# Patient Record
Sex: Male | Born: 1955
Health system: Southern US, Community
[De-identification: ages and names within clinical notes are randomized; demographics above are authoritative.]

## PROBLEM LIST (undated history)

## (undated) DIAGNOSIS — F32A Depression, unspecified: Secondary | ICD-10-CM

## (undated) DIAGNOSIS — H9190 Unspecified hearing loss, unspecified ear: Secondary | ICD-10-CM

## (undated) DIAGNOSIS — G4700 Insomnia, unspecified: Secondary | ICD-10-CM

## (undated) DIAGNOSIS — K219 Gastro-esophageal reflux disease without esophagitis: Secondary | ICD-10-CM

## (undated) DIAGNOSIS — M1990 Unspecified osteoarthritis, unspecified site: Secondary | ICD-10-CM

## (undated) DIAGNOSIS — I10 Essential (primary) hypertension: Secondary | ICD-10-CM

## (undated) DIAGNOSIS — E785 Hyperlipidemia, unspecified: Secondary | ICD-10-CM

## (undated) DIAGNOSIS — T148XXA Other injury of unspecified body region, initial encounter: Secondary | ICD-10-CM

## (undated) DIAGNOSIS — J45909 Unspecified asthma, uncomplicated: Secondary | ICD-10-CM

## (undated) DIAGNOSIS — T7840XA Allergy, unspecified, initial encounter: Secondary | ICD-10-CM

## (undated) DIAGNOSIS — F329 Major depressive disorder, single episode, unspecified: Secondary | ICD-10-CM

## (undated) HISTORY — DX: Depression, unspecified: F32.A

## (undated) HISTORY — PX: SPINE SURGERY: SHX786

## (undated) HISTORY — DX: Allergy, unspecified, initial encounter: T78.40XA

## (undated) HISTORY — DX: Gastro-esophageal reflux disease without esophagitis: K21.9

## (undated) HISTORY — DX: Unspecified osteoarthritis, unspecified site: M19.90

## (undated) HISTORY — DX: Unspecified asthma, uncomplicated: J45.909

## (undated) HISTORY — DX: Hyperlipidemia, unspecified: E78.5

## (undated) HISTORY — DX: Major depressive disorder, single episode, unspecified: F32.9

## (undated) HISTORY — DX: Essential (primary) hypertension: I10

---

## 1998-11-19 ENCOUNTER — Ambulatory Visit (HOSPITAL_COMMUNITY): Admission: RE | Admit: 1998-11-19 | Discharge: 1998-11-19 | Payer: Self-pay | Admitting: Orthopedic Surgery

## 1998-11-19 ENCOUNTER — Encounter: Payer: Self-pay | Admitting: Orthopedic Surgery

## 1998-12-06 ENCOUNTER — Encounter: Admission: RE | Admit: 1998-12-06 | Discharge: 1999-03-06 | Payer: Self-pay | Admitting: Anesthesiology

## 2004-07-18 ENCOUNTER — Emergency Department (HOSPITAL_COMMUNITY): Admission: EM | Admit: 2004-07-18 | Discharge: 2004-07-18 | Payer: Self-pay | Admitting: Emergency Medicine

## 2004-07-22 ENCOUNTER — Emergency Department (HOSPITAL_COMMUNITY): Admission: EM | Admit: 2004-07-22 | Discharge: 2004-07-22 | Payer: Self-pay | Admitting: Family Medicine

## 2004-07-27 ENCOUNTER — Emergency Department (HOSPITAL_COMMUNITY): Admission: EM | Admit: 2004-07-27 | Discharge: 2004-07-27 | Payer: Self-pay | Admitting: Emergency Medicine

## 2006-04-17 ENCOUNTER — Ambulatory Visit (HOSPITAL_COMMUNITY): Admission: RE | Admit: 2006-04-17 | Discharge: 2006-04-17 | Payer: Self-pay | Admitting: Specialist

## 2006-12-18 ENCOUNTER — Encounter: Admission: RE | Admit: 2006-12-18 | Discharge: 2006-12-18 | Payer: Self-pay | Admitting: Specialist

## 2007-01-27 ENCOUNTER — Ambulatory Visit (HOSPITAL_COMMUNITY): Admission: RE | Admit: 2007-01-27 | Discharge: 2007-01-29 | Payer: Self-pay | Admitting: Specialist

## 2009-12-07 ENCOUNTER — Emergency Department (HOSPITAL_COMMUNITY): Admission: EM | Admit: 2009-12-07 | Discharge: 2009-12-07 | Payer: Self-pay | Admitting: Emergency Medicine

## 2011-03-07 NOTE — Op Note (Signed)
NAMEWHALEN, TROMPETER              ACCOUNT NO.:  [id number]   MEDICAL RECORD NO.:  [id number]          PATIENT TYPE:   LOCATION:                                 FACILITY:   PHYSICIAN:  Jene Every, M.D.    DATE OF BIRTH:  09-12-1956   DATE OF PROCEDURE:  01/27/2007  DATE OF DISCHARGE:                               OPERATIVE REPORT   REDICTATION   PREOP DIAGNOSIS:  Spinal stenosis L4-5 and L3-4 left.   POSTOPERATIVE DIAGNOSIS:  Spinal stenosis L4-5 and L3-4 left.   PROCEDURES:  1. Redo decompression L4-5 left.  2. Hemilaminectomy of L4 left.  3. Decompression of the L3-4 left.   BRIEF HISTORY AND INDICATIONS:  This is a 55 year old with recurrent  left lower extremity radicular pain L5 nerve root distribution.  He had  an MRI indicating stenosis at L3-4 and at L4-5.  He had refractory and  conservative treatment mainly in L5 nerve root distribution, indicative  for a L4-5 decompression.  In the holding room it indicated an  alteration in nerve root distribution; and he reported some radicular  symptoms down into the L4 nerve root distribution.  I, therefore,  reexamined and reevaluated his MRI which does show significant stenosis  at  L3-4 as well as at the L4-5.  We discussed in detail, therefore,  decompressing The L4 root which will require extension to L3-4.  He had  essentially minimal-to-no right-sided symptoms so we felt containing  that to the left side would be most appropriate, although indicating  that he may require decompression on the right or centrally in the  future.  We discussed the risks and including bleeding, infection,  __________  surgery, CSF leakage, epidural fibrosis, __________  segment, and the need for fusion in the future,  anesthetic  complications etcetera.  We, therefore, amended the surgical consent  form and then proceeded, accordingly.   TECHNIQUE:  With the patient in the supine position after the induction  of adequate general  anesthesia and 2 grams of Kefzol; he was placed  prone on the __________ frame and all bony processes were well padded;  and the lumbar region prepped and draped in the usual sterile fashion.  We made an incision thorough and above the previous surgical incision.  The subcutaneous tissue was __________  utilized to achieve hemostasis.  Dorsolumbar fascia identified, divided by skin incision.  Paraspinous  muscle elevated from the lamina of L3-4 and L5.  This was confirmed then  by x-ray, in the intraoperative space after a McCullough retractor was  placed.  He had exuberant fibrosis in the paraspinous musculature as  well as the epidural region at L4-5.  The operating microscope was  draped and brought into the surgical field.  I skeletonized the lamina  of L4.  The medial aspect of the posterior portion of the facette at L4-  5; and I identified the interlaminar space at L3-4 which was fairly  narrow and shingled.  We used a 2 mm Kerrison to perform a  hemilaminotomy of the caudad edge of L4; and the cephalad edge of L5.  The epidural fibrosis that was mobilized.  We continued the cephalad  decompression.  He did have significant stenosis in the lateral recess  at the L4-5 and a decompressed facette to the medial border of the  pedicle protecting the L5 nerve root.   I performed a foraminotomy of L5 mobilizing the L5 root.  No significant  disk herniation was noted here.  It was mainly facette arthropathy,  ligamentum flavum hypertrophy, and mild bulging of the disk.  The disk  was decompressed to the medial border of the pedicle.  He did still have  some significant stenosis cephalad, and I was unable to the hockey-  stick, so I continued with the decompression at L4.  I then moved to the  interlaminar space at L3-4, hemilaminotomy at the caudad edge of L3 was  performed with a 2 and a 3-mm Kerrison in the cephalad edge of L4.  We  identified the L4 root into the lateral recess which  was significantly  compressed by mainly the ligamentum flavum and facet hypertrophy. I  performed a foraminotomy at L4, fully decompressed the L4 root which  required a hemilaminectomy of L4.  This fully decompressed the L4 root  and the proximal extension of the L5 root, as well as the thecal sac.   I continued with the decompression of the medial border of the pedicle  of L4.  I detached the ligamentum flavum from the inferior aspect of L3.  Foraminotomy of L3 was performed as well.  We mobilized the L4 root  medially.  There was a slight bulging of the disk here, but no  significant disk herniation at this point.  Bipolar electrocautery was  utilized to achieve hemostasis.  We mobilized the L4 root 1 cm medial to  the pedicle and I passed the hockey stick probe down the foramen of L3  and L4 cephalad to the pedicle of L3 without difficulty.  Again, on  examining the L4-5 space we found that was significant disk degeneration  here; and more of a hardened disk rather than a soft disk not amenable  to diskectomy.  What I felt were the foraminotomy, the decompression to  the medial border of the pedicle, and the hemilaminectomy we freed the  L4 and the L5 roots quite satisfactorily.  I checked beneath the thecal  sac and the axilla and the shoulder of the roots; and then all 3 foramen  and they were widely patent.  I copiously irrigated the space; I placed  bone wax on the cancellous surfaces; and inspection revealed no CSF  leaks or active bleeding.  I placed a thrombin-soaked Gelfoam over the  laminotomy defects, removed the McCullough retractor; and the  paraspinous muscle was inspected.  There was no active bleeding.  I  copiously irrigated this as well.  The dorsolumbar fascia was  reapproximated #1 Vicryl interrupted figure-of-eight sutures.  The  subcutaneous tissue reapproximated with 2-0 Vicryl simple sutures.  The skin was reapproximated with staples.  The wound was dressed  sterilely.  He was awoken somewhat abruptly; but was appropriately restrained,  transferred to the hospital bed, and to the recovery room in  satisfactory condition.   The patient tolerated the procedure; no complications.  Blood loss was  25 mL.   ASSISTANT:  Roma Schanz, P.A.      Jene Every, M.D.  Electronically Signed     JB/MEDQ  D:  02/18/2007  T:  02/18/2007  Job:  [id number]

## 2011-11-10 ENCOUNTER — Ambulatory Visit (INDEPENDENT_AMBULATORY_CARE_PROVIDER_SITE_OTHER): Payer: BC Managed Care – PPO

## 2011-11-10 DIAGNOSIS — J029 Acute pharyngitis, unspecified: Secondary | ICD-10-CM

## 2011-11-10 DIAGNOSIS — J019 Acute sinusitis, unspecified: Secondary | ICD-10-CM

## 2012-04-05 ENCOUNTER — Other Ambulatory Visit: Payer: Self-pay | Admitting: Emergency Medicine

## 2012-05-05 ENCOUNTER — Ambulatory Visit (INDEPENDENT_AMBULATORY_CARE_PROVIDER_SITE_OTHER): Payer: BC Managed Care – PPO | Admitting: Emergency Medicine

## 2012-05-05 VITALS — BP 146/84 | HR 74 | Temp 98.8°F | Resp 16 | Ht 71.5 in | Wt 207.6 lb

## 2012-05-05 DIAGNOSIS — I10 Essential (primary) hypertension: Secondary | ICD-10-CM

## 2012-05-05 LAB — POCT CBC
Hemoglobin: 15.6 g/dL (ref 14.1–18.1)
MCH, POC: 29.8 pg (ref 27–31.2)
MPV: 8.6 fL (ref 0–99.8)
POC MID %: 7.7 %M (ref 0–12)
RBC: 5.24 M/uL (ref 4.69–6.13)
WBC: 6.7 10*3/uL (ref 4.6–10.2)

## 2012-05-05 LAB — COMPREHENSIVE METABOLIC PANEL
ALT: 19 U/L (ref 0–53)
Albumin: 4.3 g/dL (ref 3.5–5.2)
CO2: 24 mEq/L (ref 19–32)
Calcium: 9.5 mg/dL (ref 8.4–10.5)
Chloride: 105 mEq/L (ref 96–112)
Creat: 1.02 mg/dL (ref 0.50–1.35)
Sodium: 138 mEq/L (ref 135–145)
Total Protein: 6.7 g/dL (ref 6.0–8.3)

## 2012-05-05 MED ORDER — LOSARTAN POTASSIUM 100 MG PO TABS: 100.0000 mg | ORAL_TABLET | Freq: Every day | ORAL | Status: DC
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
## 2012-05-06 ENCOUNTER — Telehealth: Payer: Self-pay

## 2012-05-06 NOTE — Telephone Encounter (Signed)
LMOM to CB. 

## 2012-05-06 NOTE — Telephone Encounter (Signed)
Notified pt of normal labs.

## 2012-05-06 NOTE — Telephone Encounter (Signed)
PT STATES  WE HAD CALLED REGARDING HIS LABS. PLEASE CALL [phone]

## 2012-05-26 ENCOUNTER — Encounter: Payer: Self-pay | Admitting: Family Medicine

## 2012-05-26 ENCOUNTER — Ambulatory Visit (INDEPENDENT_AMBULATORY_CARE_PROVIDER_SITE_OTHER): Payer: Self-pay | Admitting: Family Medicine

## 2012-05-26 VITALS — BP 142/82 | HR 72 | Temp 98.8°F | Resp 12 | Ht 72.25 in | Wt 212.0 lb

## 2012-05-26 DIAGNOSIS — F419 Anxiety disorder, unspecified: Secondary | ICD-10-CM

## 2012-05-26 DIAGNOSIS — F411 Generalized anxiety disorder: Secondary | ICD-10-CM

## 2012-05-26 DIAGNOSIS — J45909 Unspecified asthma, uncomplicated: Secondary | ICD-10-CM

## 2012-05-26 DIAGNOSIS — R1310 Dysphagia, unspecified: Secondary | ICD-10-CM | POA: Insufficient documentation

## 2012-05-26 DIAGNOSIS — I10 Essential (primary) hypertension: Secondary | ICD-10-CM | POA: Insufficient documentation

## 2012-05-26 DIAGNOSIS — J4520 Mild intermittent asthma, uncomplicated: Secondary | ICD-10-CM

## 2012-05-26 DIAGNOSIS — K219 Gastro-esophageal reflux disease without esophagitis: Secondary | ICD-10-CM | POA: Insufficient documentation

## 2012-05-26 MED ORDER — CITALOPRAM HYDROBROMIDE 20 MG PO TABS: 20.0000 mg | ORAL_TABLET | Freq: Every day | ORAL | Status: DC

## 2012-05-26 NOTE — Progress Notes (Signed)
  Subjective:    Patient ID: Jose Webb, male    DOB: [birth year], 56 y.o.   MRN: [id number]  HPI  New patient to establish care. Previously seen at urgent care Center. History of mild intermittent asthma is triggered by dust exposure, GERD, hypertension, and reported hyperlipidemia. He takes losartan for hypertension. Hx of chronic back difficulties and had 4 back surgeries saw the orthopedist apparently takes hydrocodone regularly from them. Has taken Zantac for GERD symptoms. His had some progressive dysphagia with foods the past several months. No weight loss. No appetite changes. Sometimes has difficulty finishing meals secondary to swallowing difficulties.  Hypertension marginal control losartan 100 mg daily. Compliant with therapy.  Patient complains of some chronic progressive daily anxiety and depressive symptoms. No suicidal ideation. Increased irritability.  Family history : father type 2 diabetes, hypertension, and coronary disease history  Patient is a Curator. He is married. 12th grade education. Drinks 2-6 beers per day. Nonsmoker.  Past Medical History  Diagnosis Date  . GERD (gastroesophageal reflux disease)   . Depression   . Allergy   . Hyperlipidemia   . Hypertension   . Asthma   . Arthritis    Past Surgical History  Procedure Date  . Spine surgery     age 9 and 47    reports that he has never smoked. He does not have any smokeless tobacco history on file. His alcohol and drug histories not on file. family history includes Arthritis in his mother; Diabetes (age of onset:58) in his father; Heart disease in his father; and Hypertension in his father and mother. No Known Allergies   Review of Systems  Constitutional: Negative for chills, appetite change and unexpected weight change.  HENT: Positive for trouble swallowing. Negative for sore throat.   Respiratory: Negative for cough and shortness of breath.   Gastrointestinal: Negative for nausea and  vomiting.  Musculoskeletal: Positive for back pain and arthralgias.  Neurological: Negative for dizziness.       Objective:   Physical Exam  Constitutional: He appears well-developed and well-nourished.  HENT:  Right Ear: External ear normal.  Left Ear: External ear normal.  Mouth/Throat: Oropharynx is clear and moist.  Neck: Neck supple. No thyromegaly present.  Cardiovascular: Normal rate and regular rhythm.   Pulmonary/Chest: Effort normal and breath sounds normal. No respiratory distress. He has no wheezes. He has no rales.  Abdominal: Soft. He exhibits no distension and no mass. There is no rebound and no guarding.       Minimal tenderness epigastric region  Musculoskeletal: He exhibits no edema.  Lymphadenopathy:    He has no cervical adenopathy.          Assessment & Plan:  #1 history of GERD with some progressive dysphagia. ?stricture. No worrisome weight loss or appetite change. He's been maintained on ranitidine. Decrease caffeine and ETOH.  Given progressive dysphagia symptoms, GI referral for further evaluation. #2 hypertension. Marginal control. Continue losartan. Continue close monitoring. May need additional medication if still elevated at followup  #3 chronic anxiety/depression. Celexa 20 mg daily. Touch base one month

## 2012-05-28 ENCOUNTER — Encounter: Payer: Self-pay | Admitting: Internal Medicine

## 2012-06-30 ENCOUNTER — Encounter: Payer: Self-pay | Admitting: Internal Medicine

## 2012-06-30 ENCOUNTER — Ambulatory Visit (INDEPENDENT_AMBULATORY_CARE_PROVIDER_SITE_OTHER): Payer: BC Managed Care – PPO | Admitting: Internal Medicine

## 2012-06-30 VITALS — BP 138/64 | HR 74 | Ht 72.0 in | Wt 212.0 lb

## 2012-06-30 DIAGNOSIS — Z1211 Encounter for screening for malignant neoplasm of colon: Secondary | ICD-10-CM

## 2012-06-30 DIAGNOSIS — K219 Gastro-esophageal reflux disease without esophagitis: Secondary | ICD-10-CM

## 2012-06-30 DIAGNOSIS — R1310 Dysphagia, unspecified: Secondary | ICD-10-CM

## 2012-06-30 MED ORDER — OMEPRAZOLE 20 MG PO CPDR: 20.0000 mg | DELAYED_RELEASE_CAPSULE | Freq: Every day | ORAL | Status: DC

## 2012-06-30 NOTE — Patient Instructions (Addendum)
You have been scheduled for an endoscopy with propofol. Please follow written instructions given to you at your visit today. If you use inhalers (even only as needed), please bring them with you on the day of your procedure.  You have been given samples of Prilosec to try.  If they work for you, you may purchase them over the counter.  You have been given some information on colon cancer
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
## 2012-07-19 ENCOUNTER — Encounter: Payer: BC Managed Care – PPO | Admitting: Internal Medicine

## 2012-11-04 ENCOUNTER — Other Ambulatory Visit: Payer: Self-pay | Admitting: Internal Medicine

## 2012-11-26 ENCOUNTER — Ambulatory Visit: Payer: Self-pay | Admitting: Family Medicine

## 2012-12-01 ENCOUNTER — Ambulatory Visit: Payer: Self-pay | Admitting: Family Medicine

## 2012-12-03 ENCOUNTER — Other Ambulatory Visit: Payer: Self-pay | Admitting: Internal Medicine

## 2012-12-06 ENCOUNTER — Telehealth: Payer: Self-pay

## 2012-12-06 DIAGNOSIS — K219 Gastro-esophageal reflux disease without esophagitis: Secondary | ICD-10-CM

## 2012-12-06 DIAGNOSIS — Z1211 Encounter for screening for malignant neoplasm of colon: Secondary | ICD-10-CM

## 2012-12-06 DIAGNOSIS — R1310 Dysphagia, unspecified: Secondary | ICD-10-CM

## 2012-12-06 MED ORDER — OMEPRAZOLE 20 MG PO CPDR: 20.0000 mg | DELAYED_RELEASE_CAPSULE | Freq: Every day | ORAL | Status: DC

## 2012-12-06 NOTE — Telephone Encounter (Signed)
Refilled Omeprazole 

## 2013-02-06 ENCOUNTER — Encounter (HOSPITAL_COMMUNITY): Payer: Self-pay | Admitting: Emergency Medicine

## 2013-02-06 ENCOUNTER — Emergency Department (HOSPITAL_COMMUNITY)
Admission: EM | Admit: 2013-02-06 | Discharge: 2013-02-06 | Disposition: A | Discharge status: Home / Self Care | Payer: BC Managed Care – PPO | Attending: Emergency Medicine | Admitting: Emergency Medicine

## 2013-02-06 ENCOUNTER — Ambulatory Visit (INDEPENDENT_AMBULATORY_CARE_PROVIDER_SITE_OTHER): Payer: BC Managed Care – PPO | Admitting: Physician Assistant

## 2013-02-06 VITALS — BP 163/89 | HR 124 | Temp 100.4°F | Resp 18 | Ht 73.0 in | Wt 205.4 lb

## 2013-02-06 DIAGNOSIS — J45909 Unspecified asthma, uncomplicated: Secondary | ICD-10-CM | POA: Insufficient documentation

## 2013-02-06 DIAGNOSIS — K219 Gastro-esophageal reflux disease without esophagitis: Secondary | ICD-10-CM | POA: Insufficient documentation

## 2013-02-06 DIAGNOSIS — E785 Hyperlipidemia, unspecified: Secondary | ICD-10-CM | POA: Insufficient documentation

## 2013-02-06 DIAGNOSIS — Z7982 Long term (current) use of aspirin: Secondary | ICD-10-CM | POA: Insufficient documentation

## 2013-02-06 DIAGNOSIS — J36 Peritonsillar abscess: Secondary | ICD-10-CM

## 2013-02-06 DIAGNOSIS — I10 Essential (primary) hypertension: Secondary | ICD-10-CM | POA: Insufficient documentation

## 2013-02-06 DIAGNOSIS — J029 Acute pharyngitis, unspecified: Secondary | ICD-10-CM

## 2013-02-06 DIAGNOSIS — F3289 Other specified depressive episodes: Secondary | ICD-10-CM | POA: Insufficient documentation

## 2013-02-06 DIAGNOSIS — M129 Arthropathy, unspecified: Secondary | ICD-10-CM | POA: Insufficient documentation

## 2013-02-06 DIAGNOSIS — F329 Major depressive disorder, single episode, unspecified: Secondary | ICD-10-CM | POA: Insufficient documentation

## 2013-02-06 DIAGNOSIS — Z79899 Other long term (current) drug therapy: Secondary | ICD-10-CM | POA: Insufficient documentation

## 2013-02-06 DIAGNOSIS — E860 Dehydration: Secondary | ICD-10-CM

## 2013-02-06 LAB — POCT CBC
HCT, POC: 48.5 % (ref 43.5–53.7)
Hemoglobin: 15.6 g/dL (ref 14.1–18.1)
MCH, POC: 30.5 pg (ref 27–31.2)
MCV: 94.7 fL (ref 80–97)
RBC: 5.12 M/uL (ref 4.69–6.13)
WBC: 14.6 10*3/uL — AB (ref 4.6–10.2)

## 2013-02-06 MED ORDER — MORPHINE SULFATE 4 MG/ML IJ SOLN
4.0000 mg | Freq: Once | INTRAMUSCULAR | Status: AC
  Administered 2013-02-06: 4 mg via INTRAVENOUS
  Filled 2013-02-06: qty 1

## 2013-02-06 MED ORDER — GI COCKTAIL ~~LOC~~
30.0000 mL | Freq: Once | ORAL | Status: AC
  Administered 2013-02-06: 30 mL via ORAL

## 2013-02-06 MED ORDER — HYDROCODONE-ACETAMINOPHEN 7.5-325 MG/15ML PO SOLN
15.0000 mL | ORAL | Status: DC | PRN
  Administered 2013-02-06: 15 mL via ORAL
  Filled 2013-02-06: qty 15

## 2013-02-06 MED ORDER — CLINDAMYCIN PHOSPHATE 600 MG/50ML IV SOLN
600.0000 mg | Freq: Once | INTRAVENOUS | Status: AC
  Administered 2013-02-06: 600 mg via INTRAVENOUS
  Filled 2013-02-06: qty 50

## 2013-02-06 MED ORDER — SODIUM CHLORIDE 0.9 % IV BOLUS (SEPSIS)
1000.0000 mL | Freq: Once | INTRAVENOUS | Status: AC
  Administered 2013-02-06: 1000 mL via INTRAVENOUS

## 2013-02-06 MED ORDER — OXYCODONE HCL 5 MG/5ML PO SOLN: 5.0000 mg | ORAL | Status: DC | PRN

## 2013-02-06 MED ORDER — CEFTRIAXONE SODIUM 1 G IJ SOLR
1.0000 g | Freq: Once | INTRAMUSCULAR | Status: AC
  Administered 2013-02-06: 1 g via INTRAMUSCULAR

## 2013-02-06 MED ORDER — IBUPROFEN 400 MG PO TABS
600.0000 mg | ORAL_TABLET | Freq: Four times a day (QID) | ORAL | Status: DC | PRN
  Administered 2013-02-06: 600 mg via ORAL
  Filled 2013-02-06: qty 1

## 2013-02-06 NOTE — Consult Note (Signed)
Reason for Consult:Sore throat Referring Physician: Vida Roller, MD  Jose Webb is an 57 y.o. male.  HPI: Awoke yesterday morning with a tremendous sore throat. The right side is worse. He is having trouble swallowing anything. He came to the ED from the Urgent Care at [facility]. He does not smoke. He has never had anything like this before.  Past Medical History  Diagnosis Date  . GERD (gastroesophageal reflux disease)   . Depression   . Allergy   . Hyperlipidemia   . Hypertension   . Asthma   . Arthritis     Past Surgical History  Procedure Laterality Date  . Spine surgery      age 45 and 40    Family History  Problem Relation Age of Onset  . Arthritis Mother   . Hypertension Mother   . Heart disease Father   . Hypertension Father   . Diabetes Father 46  . Colon cancer Neg Hx     Social History:  reports that he has never smoked. He has never used smokeless tobacco. He reports that  drinks alcohol. He reports that he does not use illicit drugs.  Allergies: No Known Allergies  Medications: Reviewed  Results for orders placed in visit on 02/06/13 (from the past 48 hour(s))  POCT CBC     Status: Abnormal   Collection Time    02/06/13  4:27 PM      Result Value Range   WBC 14.6 (*) 4.6 - 10.2 K/uL   Lymph, poc 1.2  0.6 - 3.4   POC LYMPH PERCENT 8.4 (*) 10 - 50 %L   MID (cbc) 0.8  0 - 0.9   POC MID % 5.2  0 - 12 %M   POC Granulocyte 12.6 (*) 2 - 6.9   Granulocyte percent 86.4 (*) 37 - 80 %G   RBC 5.12  4.69 - 6.13 M/uL   Hemoglobin 15.6  14.1 - 18.1 g/dL   HCT, POC 16.1  09.6 - 53.7 %   MCV 94.7  80 - 97 fL   MCH, POC 30.5  27 - 31.2 pg   MCHC 32.2  31.8 - 35.4 g/dL   RDW, POC 04.5     Platelet Count, POC 179  142 - 424 K/uL   MPV 9.1  0 - 99.8 fL  POCT RAPID STREP A (OFFICE)     Status: None   Collection Time    02/06/13  4:32 PM      Result Value Range   Rapid Strep A Screen Negative  Negative    No results found.  WUJ:[id number] except as  listed in admit H&P  Blood pressure 156/84, pulse 118, temperature 99.9 F (37.7 C), temperature source Oral, resp. rate 18, SpO2 98.00%.  PHYSICAL EXAM: Overall appearance:  Acutely ill-appearing, in no distress Head:  Normocephalic, atraumatic. Ears: External ears are normal. Nose: External nose is healthy in appearance. Internal nasal exam free of any lesions or obstruction. Oral Cavity:  Mild trismus. Erythema and asymmetric swelling of the right soft palate and peritonsillar tissue. Very dry mucosa. Neuro:  No identifiable neurologic deficits. Neck: Tender upper jugular adenopathy on the right side.  Studies Reviewed: none  Procedures: Xylocaine/epi was infiltrated into the right soft palate. 18 G needle was used to attempt to aspirate multiple locations on the right side and no purulence was identified.    Assessment/Plan: Severe tonsillitis, no abscess. Will give him IV fluid bolus until he voids, and then  he will decide if he wants to try to go home and drink, or stay in hospital for IV hydration.   Aprille Sawhney 02/06/2013, 7:02 PM

## 2013-02-06 NOTE — ED Notes (Signed)
Pt now able to urinate. Pt ambulated to restroom, NAD. Pt able to speak better. States his throat is still very painful, however pt was able to swallow water and medication.

## 2013-02-06 NOTE — ED Provider Notes (Signed)
History     CSN: [id number]  Arrival date & time 02/06/13  1718   First MD Initiated Contact with Patient 02/06/13 1724      Chief Complaint  Patient presents with  . Sore Throat    (Consider location/radiation/quality/duration/timing/severity/associated sxs/prior treatment) HPI Comments: 57 year old male presents from urgent care with complaint of sore throat. Patient states has had 2 days of sore throat right greater than left, gradually worsening, no difficulty speaking and opening mouth. Fever noted at the outside facility of 100.4, sent to the emergency department for further evaluation by ear nose and throat specialist. Patient denies coughing, vomiting, swelling of legs or any other complaints.  Patient is a 57 y.o. male presenting with pharyngitis. The history is provided by the patient and medical records.  Sore Throat    Past Medical History  Diagnosis Date  . GERD (gastroesophageal reflux disease)   . Depression   . Allergy   . Hyperlipidemia   . Hypertension   . Asthma   . Arthritis     Past Surgical History  Procedure Laterality Date  . Spine surgery      age 4 and 30    Family History  Problem Relation Age of Onset  . Arthritis Mother   . Hypertension Mother   . Heart disease Father   . Hypertension Father   . Diabetes Father 67  . Colon cancer Neg Hx     History  Substance Use Topics  . Smoking status: Never Smoker   . Smokeless tobacco: Never Used  . Alcohol Use: Yes     Comment: 5 drinks a day       Review of Systems  All other systems reviewed and are negative.    Allergies  Review of patient's allergies indicates no known allergies.  Home Medications   Current Outpatient Rx  Name  Route  Sig  Dispense  Refill  . aspirin 81 MG tablet   Oral   Take 81 mg by mouth daily.         . diphenhydrAMINE (BENADRYL) 25 mg capsule   Oral   Take 25 mg by mouth every 6 (six) hours as needed for allergies.          Marland Kitchen  HYDROcodone-acetaminophen (NORCO) 7.5-325 MG per tablet   Oral   Take 1 tablet by mouth every 6 (six) hours as needed for pain.          Marland Kitchen losartan (COZAAR) 100 MG tablet   Oral   Take 100 mg by mouth daily.         Marland Kitchen omeprazole (PRILOSEC) 20 MG capsule   Oral   Take 20 mg by mouth daily.         . ranitidine (ZANTAC) 150 MG tablet   Oral   Take 150 mg by mouth 2 (two) times daily.         . vitamin C (ASCORBIC ACID) 500 MG tablet   Oral   Take 1,000 mg by mouth daily.           BP 156/84  Pulse 118  Temp(Src) 99.9 F (37.7 C) (Oral)  Resp 18  SpO2 98%  Physical Exam  Nursing note and vitals reviewed. Constitutional: He appears well-developed and well-nourished. No distress.  HENT:  Head: Normocephalic and atraumatic.  Mouth/Throat: No oropharyngeal exudate.  Oropharynx has a symmetric uvula, right peritonsillar abscess present, bilateral exudate on the tonsillar pillars, mucous membranes slightly dehydrated, posterior pharynx visualized with tongue depressor.  Eyes: Conjunctivae and EOM are normal. Pupils are equal, round, and reactive to light. Right eye exhibits no discharge. Left eye exhibits no discharge. No scleral icterus.  Neck: Normal range of motion. Neck supple. No JVD present. No thyromegaly present.  Neck supple, no torticollis  Cardiovascular: Regular rhythm, normal heart sounds and intact distal pulses.  Exam reveals no gallop and no friction rub.   No murmur heard. Tachycardic to 115  Pulmonary/Chest: Effort normal and breath sounds normal. No respiratory distress. He has no wheezes. He has no rales.  Abdominal: Soft. Bowel sounds are normal. He exhibits no distension and no mass. There is no tenderness.  Musculoskeletal: Normal range of motion. He exhibits no edema and no tenderness.  Lymphadenopathy:    He has no cervical adenopathy.  Neurological: He is alert. Coordination normal.  Skin: Skin is warm and dry. No rash noted. No erythema.   Psychiatric: He has a normal mood and affect. His behavior is normal.    ED Course  Procedures (including critical care time)  Labs Reviewed - No data to display No results found.   No diagnosis found.    MDM  On my exam the patient has an exam consistent with a peritonsillar abscess on the right, he has a fever and tachycardia, ENT has been paged to see the patient in the emergency department, I ordered IV antibiotics and pain medications. At this time he is maintaining his airway with no difficulty, tolerating secretions well.  dW Dr. Pollyann Kennedy who will see in the ED.  Clinda and Morphine given.  Dr. Pollyann Kennedy has attempted drainage - no purulence present, will give IVF and see if improves, may need admission.  NP Manus Rudd to follow results and notify Dr. Pollyann Kennedy as needed.  If home, will need pain medicine and clindamycin with close f/u in the office.    Vida Roller, MD 02/06/13 [phone]

## 2013-02-06 NOTE — ED Provider Notes (Signed)
  Physical Exam  BP 128/81  Pulse 118  Temp(Src) 99.1 F (37.3 C) (Oral)  Resp 20  SpO2 97%  Physical Exam Received 2 L of IV fluid, in the emergency department.  He is unable to swallow and states he feels, better.  He'll be discharged home with a prescription for Roxicodone and he understands he is to followup with Dr. Pollyann Kennedy in several days ED Course  Procedures  MDM        Arman Filter, NP 02/06/13 2151

## 2013-02-06 NOTE — Progress Notes (Signed)
Subjective:    Patient ID: Jose Webb, male    DOB: 08/18/1956, 57 y.o.   MRN: [id number]  HPI This 57 y.o. male presents for evaluation of sore throat that began yesterday.  Subjective fever, chills. Too painful to swallow-is spitting out his oral secretions.  Unable to eat/drink due to pain. No nasal congestion, cough, ear discomfort. No GI/GU symptoms. No better with hydrocodone. "If I don't get an antibiotic now, I'm gonna die."   Past Medical History  Diagnosis Date  . GERD (gastroesophageal reflux disease)   . Depression   . Allergy   . Hyperlipidemia   . Hypertension   . Asthma   . Arthritis     Past Surgical History  Procedure Laterality Date  . Spine surgery      age 88 and 11    Prior to Admission medications   Medication Sig Start Date End Date Taking? Authorizing Provider  aspirin 81 MG tablet Take 81 mg by mouth daily.   Yes Historical Provider, MD  citalopram (CELEXA) 20 MG tablet Take 1 tablet (20 mg total) by mouth daily. 05/26/12 05/26/13 Yes Kristian Covey, MD  diphenhydrAMINE (BENADRYL) 25 mg capsule Take 25 mg by mouth every 6 (six) hours as needed.   Yes Historical Provider, MD  HYDROcodone-acetaminophen (NORCO) 7.5-325 MG per tablet Take 1 tablet by mouth every 6 (six) hours as needed.   Yes Historical Provider, MD  losartan (COZAAR) 100 MG tablet Take 1 tablet (100 mg total) by mouth daily. NEEDS OFFICE VISIT FOR MORE REFILLS 05/05/12  Yes Collene Gobble, MD  omeprazole (PRILOSEC) 20 MG capsule Take 1 capsule (20 mg total) by mouth daily. 12/06/12 12/06/13 Yes Hilarie Fredrickson, MD  ranitidine (ZANTAC) 150 MG tablet Take 150 mg by mouth 2 (two) times daily.   Yes Historical Provider, MD    No Known Allergies  History   Social History  . Marital Status: Married    Number of Children: 1   Occupational History  . Mechanic    Social History Main Topics  . Smoking status: Never Smoker   . Smokeless tobacco: Never Used  . Alcohol Use: Yes     Comment:  5 drinks a day   . Drug Use: No  . Sexually Active: Yes   Social History Narrative   Daily caffeine     Family History  Problem Relation Age of Onset  . Arthritis Mother   . Hypertension Mother   . Heart disease Father   . Hypertension Father   . Diabetes Father 27  . Colon cancer Neg Hx      Review of Systems As above.    Objective:   Physical Exam Blood pressure 163/89, pulse 124, temperature 100.4 F (38 C), temperature source Oral, resp. rate 18, height 6\' 1"  (1.854 m), weight 205 lb 6.4 oz (93.169 kg), SpO2 97.00%. Body mass index is 27.11 kg/(m^2). Well-developed, well nourished WM who is awake, alert and oriented, in mild-moderate distress. Altered voice.  Spitting saliva. HEENT: [place]/AT, PERRL, EOMI.  Sclera and conjunctiva are clear.  EAC are patent, TMs are normal in appearance. Nasal mucosa is pink and moist. OP is notable for a large bulge on the RIGHT, consistent with peritonsillar abscess. Neck: supple,with fullness and tenderness on the RIGHT. Heart: tachycardic, but regular Lungs: normal effort Extremities: no cyanosis, clubbing or edema. Skin: warm and dry without rash. Psychologic: good mood and appropriate affect, normal speech and behavior.    Results for orders  placed in visit on 02/06/13  POCT CBC      Result Value Range   WBC 14.6 (*) 4.6 - 10.2 K/uL   Lymph, poc 1.2  0.6 - 3.4   POC LYMPH PERCENT 8.4 (*) 10 - 50 %L   MID (cbc) 0.8  0 - 0.9   POC MID % 5.2  0 - 12 %M   POC Granulocyte 12.6 (*) 2 - 6.9   Granulocyte percent 86.4 (*) 37 - 80 %G   RBC 5.12  4.69 - 6.13 M/uL   Hemoglobin 15.6  14.1 - 18.1 g/dL   HCT, POC 29.5  62.1 - 53.7 %   MCV 94.7  80 - 97 fL   MCH, POC 30.5  27 - 31.2 pg   MCHC 32.2  31.8 - 35.4 g/dL   RDW, POC 30.8     Platelet Count, POC 179  142 - 424 K/uL   MPV 9.1  0 - 99.8 fL  POCT RAPID STREP A (OFFICE)      Result Value Range   Rapid Strep A Screen Negative  Negative   No improvement with GI cocktail.     Assessment & Plan:  Peritonsillar abscess - Plan: cefTRIAXone (ROCEPHIN) injection 1 g  Acute pharyngitis - Plan: POCT CBC, POCT rapid strep A, gi cocktail (Maalox,Lidocaine,Donnatal)  Dehydration  I spoke with ENT on call.  Given the patient's inability to swallow fluids, and no oral hydration all day, he is advised to go directly to the ED for hydration and specialty ENT evaluation.  Charge nurse notified.   Patient Instructions  Go directly to the Emergency Department at [facility].   The charge nurse is aware that you are on the way.   The on-call ENT specialist will be contacted when you are there.   You likely need intravenous fluids, and you may need to have the abscess drained.    Fernande Bras, PA-C Physician Assistant-Certified [facility]

## 2013-02-06 NOTE — ED Notes (Signed)
Pt here from [facility] with peri tonsillar abscess; pt to see Dr Pollyann Kennedy

## 2013-02-06 NOTE — Patient Instructions (Addendum)
Go directly to the Emergency Department at [facility].   The charge nurse is aware that you are on the way.   The on-call ENT specialist will be contacted when you are there.   You likely need intravenous fluids, and you may need to have the abscess drained.

## 2013-02-06 NOTE — ED Notes (Signed)
Pt states yesterday he developed a sore throat. Today he had trouble swallowing, speaking. Pt states he is not having any trouble breathing. Pt seen at [facility], was transferred here for peritonsillar abscess. Pt right tonsil is extremely red and swollen

## 2013-02-07 ENCOUNTER — Telehealth: Payer: Self-pay | Admitting: Family Medicine

## 2013-02-07 NOTE — ED Provider Notes (Signed)
Medical screening examination/treatment/procedure(s) were performed by non-physician practitioner and as supervising physician I was immediately available for consultation/collaboration.    Vida Roller, MD 02/07/13 1946

## 2013-02-07 NOTE — Telephone Encounter (Signed)
[facility] Triage Call Report Triage Record Num: [id number] Operator: Geanie Berlin Patient Name: Jose Webb Call Date & Time: 02/06/2013 10:55:07AM Patient Phone: [phone] PCP: Evelena Peat Patient Gender: Male PCP Fax : [phone] Patient DOB: May 22, 1956 [facility] Name: [facility] Reason for Call: Caller: Lacinda Axon; PCP: Evelena Peat (Family Practice); CB#: [phone]; Call regarding Sore Throat; Requesting antibiotic, be called in. Reports "sinus infection" with green productive cough, sore throat and malaise. Onset 02/05/13. Afebrile. Painful to swallow; poor fluid intake. Voided at 1000. Currently in bed feeling poorly. Tried hot tea with lemon and honey. Using throat lozenges without relief. Has not tried Tylenol or Ibuprofen. Informed must be seen for antibiotics per MD orders. Comfort measures reviewed including use of Iburprofen 400 mg po every 6-8 hours, warm fluids followed by honey, cold fluids and soft foods. Hydrate and humidify. Advised to see ED or UC within 4 hours for marked difficulty swallowing due to sore throat unresponsive to 12 hours of home care per Sore Throat Guideline. Declined to go to ED or UC; plans to follow up with office 02/07/13. Protocol(s) Used: Sore Throat or Hoarseness Recommended Outcome per Protocol: See Provider within 4 hours Reason for Outcome: Marked difficulty swallowing due to sore throat unresponsive to 12 hours of home care Care Advice: ~ May inhale steam from hot shower or heated water. Be careful to avoid burns. Call EMS 911 if sudden onset or sudden worsening of breathing problems, struggling to breathe, high pitched noise when breathing in (stridor), unable to speak, grasping at throat, or panic/anxiety because of breathing problems. ~ ~ SYMPTOM / CONDITION MANAGEMENT Analgesic/Antipyretic Advice - NSAIDs: Consider aspirin, ibuprofen, naproxen or ketoprofen for pain or fever as directed on label or by  pharmacist/provider. PRECAUTIONS: - If over 33 years of age, should not take longer than 1 week without consulting provider. EXCEPTIONS: - Should not be used if taking blood thinners or have bleeding problems. - Do not use if have history of sensitivity/allergy to any of these medications; or history of cardiovascular, ulcer, kidney, liver disease or diabetes unless approved by provider. - Do not exceed recommended dose or frequency. ~ Sore Throat Relief: - Use salt water gargles (1/2 teaspoon salt in 8 oz. [247mL] warm water) every one to two hours. - Use a vaporizer or cool mist humidifier in the room when sleeping. - Suck on hard candy, nonprescription or herbal throat lozenges (sugar-free if diabetic) - Eat soothing, soft food/fluids (broths, soups, or honey and lemon juice in hot tea, Popsicles, frozen yogurt or sherbet, scrambled eggs, cooked cereals, Jell-O or puddings) whichever is most comforting. - Avoid eating salty, spicy or acidic foods. ~ 02/06/2013 11:13:01AM Page 1 of 1 [id number]

## 2013-02-09 ENCOUNTER — Telehealth: Payer: Self-pay | Admitting: *Deleted

## 2013-02-09 NOTE — Telephone Encounter (Signed)
Pt wife calling, worried about her husband who was seen in the ER.  They did not get any results of his labs done.  He has not eaten in 5 days.  He has an appt with Dr. Pollyann Kennedy tomorrow.  FYI.  Wife wanted Dr Caryl Never to review his labs and report back to them. [phone], Karens number

## 2013-02-09 NOTE — Telephone Encounter (Signed)
Pt wife informed

## 2013-02-09 NOTE — Telephone Encounter (Signed)
Reviewed. He had negative strep screen and high WBC on CBC (nonspecific but suggests infection)

## 2013-04-05 ENCOUNTER — Ambulatory Visit (INDEPENDENT_AMBULATORY_CARE_PROVIDER_SITE_OTHER): Payer: BC Managed Care – PPO | Admitting: Family Medicine

## 2013-04-05 ENCOUNTER — Encounter: Payer: Self-pay | Admitting: Family Medicine

## 2013-04-05 VITALS — BP 130/78 | Temp 98.5°F | Wt 216.0 lb

## 2013-04-05 DIAGNOSIS — I10 Essential (primary) hypertension: Secondary | ICD-10-CM

## 2013-04-05 DIAGNOSIS — E785 Hyperlipidemia, unspecified: Secondary | ICD-10-CM | POA: Insufficient documentation

## 2013-04-05 DIAGNOSIS — K219 Gastro-esophageal reflux disease without esophagitis: Secondary | ICD-10-CM

## 2013-04-05 LAB — HEPATIC FUNCTION PANEL
AST: 21 U/L (ref 0–37)
Albumin: 4 g/dL (ref 3.5–5.2)
Total Protein: 7.3 g/dL (ref 6.0–8.3)

## 2013-04-05 LAB — LIPID PANEL
HDL: 49.6 mg/dL (ref >39.00)
LDL Cholesterol: 87 mg/dL (ref 0–99)
Total CHOL/HDL Ratio: 3
Triglycerides: 183 mg/dL — ABNORMAL HIGH (ref 0.0–149.0)

## 2013-04-05 LAB — BASIC METABOLIC PANEL
CO2: 22 mEq/L (ref 19–32)
Calcium: 10.1 mg/dL (ref 8.4–10.5)
Chloride: 107 mEq/L (ref 96–112)
Sodium: 139 mEq/L (ref 135–145)

## 2013-04-05 MED ORDER — OMEPRAZOLE 20 MG PO CPDR: 20.0000 mg | DELAYED_RELEASE_CAPSULE | Freq: Every day | ORAL | Status: DC

## 2013-04-05 MED ORDER — LOSARTAN POTASSIUM 100 MG PO TABS: 100.0000 mg | ORAL_TABLET | Freq: Every day | ORAL | Status: DC

## 2013-04-05 NOTE — Progress Notes (Signed)
  Subjective:    Patient ID: Jose Webb, male    DOB: 11-Mar-1956, 57 y.o.   MRN: [id number]  HPI Medical followup Patient has history of hypertension, GERD and reported hyperlipidemia Currently does not take any antilipid medications. Strong family history of CAD. Patient compliant with medications. Does not monitor blood pressures at home. No recent headaches or dizziness. No chest pains. GERD symptoms adequately controlled with omeprazole. He takes baby aspirin one daily. Denies any recent symptoms of hyperglycemia  He's had some ongoing back difficulties and sees orthopedist and takes intermittent hydrocodone per orthopedics for that  Past Medical History  Diagnosis Date  . GERD (gastroesophageal reflux disease)   . Depression   . Allergy   . Hyperlipidemia   . Hypertension   . Asthma   . Arthritis    Past Surgical History  Procedure Laterality Date  . Spine surgery      age 31 and 85    reports that he has never smoked. He has never used smokeless tobacco. He reports that  drinks alcohol. He reports that he does not use illicit drugs. family history includes Arthritis in his mother; Diabetes (age of onset: 38) in his father; Heart disease in his father; and Hypertension in his father and mother.  There is no history of Colon cancer. No Known Allergies    Review of Systems  Constitutional: Negative for fatigue and unexpected weight change.  Eyes: Negative for visual disturbance.  Respiratory: Negative for cough, chest tightness and shortness of breath.   Cardiovascular: Negative for chest pain, palpitations and leg swelling.  Neurological: Negative for dizziness, syncope, weakness, light-headedness and headaches.       Objective:   Physical Exam  Constitutional: He appears well-developed and well-nourished.  HENT:  Mouth/Throat: Oropharynx is clear and moist.  Neck: Neck supple. No thyromegaly present.  Cardiovascular: Normal rate and regular rhythm.    Pulmonary/Chest: Effort normal and breath sounds normal. No respiratory distress. He has no wheezes. He has no rales.  Musculoskeletal: He exhibits no edema.  Lymphadenopathy:    He has no cervical adenopathy.          Assessment & Plan:  #1 hypertension. Stable. Refill losartan for one year #2 GERD. Stable. Refilled omeprazole for one year #3 history of reported hyperlipidemia. Check fasting lipid panel Consider complete physical at some point later this year

## 2013-04-06 NOTE — Progress Notes (Signed)
Quick Note:  Pt informed ______ 

## 2013-06-16 ENCOUNTER — Ambulatory Visit (INDEPENDENT_AMBULATORY_CARE_PROVIDER_SITE_OTHER): Payer: BC Managed Care – PPO | Admitting: Family Medicine

## 2013-06-16 ENCOUNTER — Encounter: Payer: Self-pay | Admitting: Family Medicine

## 2013-06-16 VITALS — BP 130/60 | HR 88 | Temp 98.3°F | Wt 215.0 lb

## 2013-06-16 DIAGNOSIS — G4700 Insomnia, unspecified: Secondary | ICD-10-CM

## 2013-06-16 MED ORDER — CLONAZEPAM 0.5 MG PO TABS: ORAL_TABLET | ORAL | Status: DC

## 2013-06-16 NOTE — Patient Instructions (Addendum)

## 2013-06-16 NOTE — Progress Notes (Signed)
  Subjective:    Patient ID: Jose Webb, male    DOB: 1956/03/12, 57 y.o.   MRN: [id number]  HPI Seen with chief complaint of insomnia He relates is partly to increased job stress. He has history of chronic pain and is followed elsewhere and maintained on hydrocodone. He feels these had worsened insomnia since starting hydrocodone. Occasional alcohol use but not regularly. He avoids caffeine after about 12 PM.  Sometimes has difficulty falling asleep and sometimes early morning awakening. Denies major depressive symptoms. Other problems include history of GERD which is controlled with omeprazole. Losartan for hypertension and blood pressures been stable  Past Medical History  Diagnosis Date  . GERD (gastroesophageal reflux disease)   . Depression   . Allergy   . Hyperlipidemia   . Hypertension   . Asthma   . Arthritis    Past Surgical History  Procedure Laterality Date  . Spine surgery      age 12 and 54    reports that he has never smoked. He has never used smokeless tobacco. He reports that  drinks alcohol. He reports that he does not use illicit drugs. family history includes Arthritis in his mother; Diabetes (age of onset: 17) in his father; Heart disease in his father; Hypertension in his father and mother. There is no history of Colon cancer. No Known Allergies    Review of Systems  Constitutional: Negative for fever, chills, appetite change and unexpected weight change.  Respiratory: Negative for cough.   Cardiovascular: Negative for chest pain.  Psychiatric/Behavioral: Positive for sleep disturbance. Negative for confusion, dysphoric mood and agitation. The patient is nervous/anxious.        Objective:   Physical Exam  Constitutional: He appears well-developed and well-nourished.  HENT:  Right Ear: External ear normal.  Left Ear: External ear normal.  Mouth/Throat: Oropharynx is clear and moist.  Neck: Neck supple. No thyromegaly present.   Cardiovascular: Normal rate and regular rhythm.   Pulmonary/Chest: Effort normal and breath sounds normal. No respiratory distress. He has no wheezes. He has no rales.  Psychiatric: He has a normal mood and affect. His behavior is normal.          Assessment & Plan:  Insomnia. Suspect related to stressors. Sleep hygiene discussed. Avoid regular use of alcohol. Short-term when necessary use only of clonazepam 0.5 mg one to 2 each bedtime

## 2013-06-17 ENCOUNTER — Ambulatory Visit: Payer: BC Managed Care – PPO | Admitting: Family Medicine

## 2013-09-20 ENCOUNTER — Telehealth: Payer: Self-pay | Admitting: Family Medicine

## 2013-09-20 NOTE — Telephone Encounter (Signed)
I have not seen any letters in regard to this.

## 2013-09-20 NOTE — Telephone Encounter (Signed)
Do you know about this letter.

## 2013-09-20 NOTE — Telephone Encounter (Signed)
Pt was denied [employer] coverage. Pt stated the denial letter was sent to MD. Pt would like to know the reason of denial

## 2013-09-21 ENCOUNTER — Encounter: Payer: Self-pay | Admitting: *Deleted

## 2013-09-22 ENCOUNTER — Encounter: Payer: Self-pay | Admitting: Internal Medicine

## 2013-09-22 ENCOUNTER — Ambulatory Visit (INDEPENDENT_AMBULATORY_CARE_PROVIDER_SITE_OTHER): Payer: BC Managed Care – PPO | Admitting: Internal Medicine

## 2013-09-22 VITALS — BP 150/84 | HR 61 | Temp 99.1°F | Wt 213.0 lb

## 2013-09-22 DIAGNOSIS — R200 Anesthesia of skin: Secondary | ICD-10-CM

## 2013-09-22 DIAGNOSIS — S161XXA Strain of muscle, fascia and tendon at neck level, initial encounter: Secondary | ICD-10-CM

## 2013-09-22 DIAGNOSIS — R209 Unspecified disturbances of skin sensation: Secondary | ICD-10-CM

## 2013-09-22 DIAGNOSIS — S139XXA Sprain of joints and ligaments of unspecified parts of neck, initial encounter: Secondary | ICD-10-CM
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
## 2014-02-15 ENCOUNTER — Telehealth: Payer: Self-pay | Admitting: Family Medicine

## 2014-02-15 MED ORDER — OMEPRAZOLE 20 MG PO CPDR: 20.0000 mg | DELAYED_RELEASE_CAPSULE | Freq: Every day | ORAL | Status: DC

## 2014-02-15 MED ORDER — LOSARTAN POTASSIUM 100 MG PO TABS: 100.0000 mg | ORAL_TABLET | Freq: Every day | ORAL | Status: DC

## 2014-02-15 NOTE — Telephone Encounter (Signed)
And losartan (COZAAR) 100 MG tablet

## 2014-02-15 NOTE — Telephone Encounter (Signed)
[facility] - [place], [place] - [address] is requesting re-fill on omeprazole (PRILOSEC) 20 MG capsule °

## 2014-02-15 NOTE — Telephone Encounter (Signed)
[facility] 

## 2014-03-17 ENCOUNTER — Encounter: Payer: Self-pay | Admitting: Family Medicine

## 2014-03-17 ENCOUNTER — Ambulatory Visit (INDEPENDENT_AMBULATORY_CARE_PROVIDER_SITE_OTHER): Payer: No Typology Code available for payment source | Admitting: Family Medicine

## 2014-03-17 VITALS — BP 140/88 | HR 90 | Temp 98.2°F | Wt 229.0 lb

## 2014-03-17 DIAGNOSIS — G4700 Insomnia, unspecified: Secondary | ICD-10-CM

## 2014-03-17 DIAGNOSIS — W57XXXA Bitten or stung by nonvenomous insect and other nonvenomous arthropods, initial encounter: Secondary | ICD-10-CM

## 2014-03-17 DIAGNOSIS — T148 Other injury of unspecified body region: Secondary | ICD-10-CM

## 2014-03-17 MED ORDER — DIAZEPAM 5 MG PO TABS: 5.0000 mg | ORAL_TABLET | Freq: Every evening | ORAL | Status: DC | PRN

## 2014-03-17 NOTE — Progress Notes (Signed)
   Subjective:    Patient ID: Jose Webb, male    DOB: July 10, 1956, 58 y.o.   MRN: [id number]  HPI Recent tick bite. This was a little over 2 weeks ago. He describes as a deer tick. Removed from just underneath the right breast. He had been up in [place]. He developed almost immediately some redness around the site with a clear center. He went to the orthopedist was prescribed doxycycline 100 mg twice daily for 14 days. He has only minimal residual redness now. Never had any headache, fever, or chills.  Second issue is he has some insomnia now. Has been somewhat chronic and intermittent and has previously taken low-dose clonazepam but had some hangover drowsiness. Previously on diazepam low dosage which seemed to help. No consistent alcohol use. No late day use of caffeine use. He is looking at upcoming right knee replacement secondary to osteoarthritis.  Past Medical History  Diagnosis Date  . GERD (gastroesophageal reflux disease)   . Depression   . Allergy   . Hyperlipidemia   . Hypertension   . Asthma   . Arthritis    Past Surgical History  Procedure Laterality Date  . Spine surgery      age 3 and 31    reports that he has never smoked. He has never used smokeless tobacco. He reports that he drinks alcohol. He reports that he does not use illicit drugs. family history includes Arthritis in his mother; Diabetes (age of onset: 24) in his father; Heart disease in his father; Hypertension in his father and mother. There is no history of Colon cancer. No Known Allergies    Review of Systems  Constitutional: Negative for fever and chills.  Respiratory: Negative for shortness of breath.   Skin: Positive for rash.  Neurological: Negative for headaches.  Hematological: Negative for adenopathy.  Psychiatric/Behavioral: Negative for dysphoric mood. The patient is nervous/anxious.        Objective:   Physical Exam  Constitutional: He appears well-developed and  well-nourished.  Neck: Neck supple.  Cardiovascular: Normal rate and regular rhythm.   Pulmonary/Chest: Effort normal and breath sounds normal. No respiratory distress. He has no wheezes. He has no rales.  Musculoskeletal: He exhibits no edema.  Lymphadenopathy:    He has no cervical adenopathy.  Skin: Rash noted.  Underneath the right breast has an area of mild erythema about 1 cm diameter. No warmth. Nontender. No pustules. Slightly punctate center. No fluctuance  Psychiatric: He has a normal mood and affect. His behavior is normal.          Assessment & Plan:  #1 recent tick bite with likely local allergic reaction. Continue antihistamine. No indication for further antibiotics. Took full 14 day course of doxycycline #2 insomnia. Transient. Discussed sleep hygiene. Diazepam 5 mg each bedtime for severe insomnia but use infrequently

## 2014-03-17 NOTE — Progress Notes (Signed)
Pre visit review using our clinic review tool, if applicable. No additional management support is needed unless otherwise documented below in the visit note. 

## 2014-03-17 NOTE — Patient Instructions (Signed)
Tick Bite Information Ticks are insects that attach themselves to the skin and draw blood for food. There are various types of ticks. Common types include wood ticks and deer ticks. Most ticks live in shrubs and grassy areas. Ticks can climb onto your body when you make contact with leaves or grass where the tick is waiting. The most common places on the body for ticks to attach themselves are the scalp, neck, armpits, waist, and groin. Most tick bites are harmless, but sometimes ticks carry germs that cause diseases. These germs can be spread to a person during the tick's feeding process. The chance of a disease spreading through a tick bite depends on:   The type of tick.  Time of year.   How long the tick is attached.   Geographic location.  HOW CAN YOU PREVENT TICK BITES? Take these steps to help prevent tick bites when you are outdoors:  Wear protective clothing. Long sleeves and long pants are best.   Wear white clothes so you can see ticks more easily.  Tuck your pant legs into your socks.   If walking on a trail, stay in the middle of the trail to avoid brushing against bushes.  Avoid walking through areas with long grass.  Put insect repellent on all exposed skin and along boot tops, pant legs, and sleeve cuffs.   Check clothing, hair, and skin repeatedly and before going inside.   Brush off any ticks that are not attached.  Take a shower or bath as soon as possible after being outdoors.  WHAT IS THE PROPER WAY TO REMOVE A TICK? Ticks should be removed as soon as possible to help prevent diseases caused by tick bites. 1. If latex gloves are available, put them on before trying to remove a tick.  2. Using fine-point tweezers, grasp the tick as close to the skin as possible. You may also use curved forceps or a tick removal tool. Grasp the tick as close to its head as possible. Avoid grasping the tick on its body. 3. Pull gently with steady upward pressure until  the tick lets go. Do not twist the tick or jerk it suddenly. This may break off the tick's head or mouth parts. 4. Do not squeeze or crush the tick's body. This could force disease-carrying fluids from the tick into your body.  5. After the tick is removed, wash the bite area and your hands with soap and water or other disinfectant such as alcohol. 6. Apply a small amount of antiseptic cream or ointment to the bite site.  7. Wash and disinfect any instruments that were used.  Do not try to remove a tick by applying a hot match, petroleum jelly, or fingernail polish to the tick. These methods do not work and may increase the chances of disease being spread from the tick bite.  WHEN SHOULD YOU SEEK MEDICAL CARE? Contact your health care provider if you are unable to remove a tick from your skin or if a part of the tick breaks off and is stuck in the skin.  After a tick bite, you need to be aware of signs and symptoms that could be related to diseases spread by ticks. Contact your health care provider if you develop any of the following in the days or weeks after the tick bite:  Unexplained fever.  Rash. A circular rash that appears days or weeks after the tick bite may indicate the possibility of Lyme disease. The rash may resemble   a target with a bull's-eye and may occur at a different part of your body than the tick bite.  Redness and swelling in the area of the tick bite.   Tender, swollen lymph glands.   Diarrhea.   Weight loss.   Cough.   Fatigue.   Muscle, joint, or bone pain.   Abdominal pain.   Headache.   Lethargy or a change in your level of consciousness.  Difficulty walking or moving your legs.   Numbness in the legs.   Paralysis.  Shortness of breath.   Confusion.   Repeated vomiting.  Document Released: 10/03/2000 Document Revised: 07/27/2013 Document Reviewed: 03/16/2013 ExitCare Patient Information 2014 [employer].  

## 2014-03-28 ENCOUNTER — Other Ambulatory Visit: Payer: Self-pay | Admitting: Orthopedic Surgery

## 2014-04-11 ENCOUNTER — Encounter (HOSPITAL_COMMUNITY): Payer: Self-pay | Admitting: Pharmacy Technician

## 2014-04-18 ENCOUNTER — Encounter (HOSPITAL_COMMUNITY): Payer: Self-pay

## 2014-04-18 ENCOUNTER — Encounter (HOSPITAL_COMMUNITY)
Admission: RE | Admit: 2014-04-18 | Discharge: 2014-04-18 | Disposition: A | Discharge status: Home / Self Care | Payer: No Typology Code available for payment source | Source: Ambulatory Visit | Attending: Specialist | Admitting: Specialist

## 2014-04-18 ENCOUNTER — Encounter (INDEPENDENT_AMBULATORY_CARE_PROVIDER_SITE_OTHER): Payer: Self-pay

## 2014-04-18 ENCOUNTER — Other Ambulatory Visit: Payer: Self-pay

## 2014-04-18 ENCOUNTER — Ambulatory Visit (HOSPITAL_COMMUNITY)
Admission: RE | Admit: 2014-04-18 | Discharge: 2014-04-18 | Disposition: A | Discharge status: Home / Self Care | Payer: No Typology Code available for payment source | Source: Ambulatory Visit | Attending: Orthopedic Surgery | Admitting: Orthopedic Surgery

## 2014-04-18 ENCOUNTER — Other Ambulatory Visit: Payer: Self-pay | Admitting: Orthopedic Surgery

## 2014-04-18 DIAGNOSIS — Z01818 Encounter for other preprocedural examination: Secondary | ICD-10-CM | POA: Insufficient documentation

## 2014-04-18 DIAGNOSIS — Z01810 Encounter for preprocedural cardiovascular examination: Secondary | ICD-10-CM | POA: Insufficient documentation

## 2014-04-18 DIAGNOSIS — Z01812 Encounter for preprocedural laboratory examination: Secondary | ICD-10-CM | POA: Insufficient documentation

## 2014-04-18 DIAGNOSIS — I10 Essential (primary) hypertension: Secondary | ICD-10-CM | POA: Insufficient documentation

## 2014-04-18 HISTORY — DX: Other injury of unspecified body region, initial encounter: T14.8XXA

## 2014-04-18 HISTORY — DX: Insomnia, unspecified: G47.00

## 2014-04-18 HISTORY — DX: Unspecified hearing loss, unspecified ear: H91.90

## 2014-04-18 LAB — URINALYSIS, ROUTINE W REFLEX MICROSCOPIC
BILIRUBIN URINE: NEGATIVE
GLUCOSE, UA: NEGATIVE mg/dL
KETONES UR: NEGATIVE mg/dL
LEUKOCYTES UA: NEGATIVE
Nitrite: NEGATIVE
PH: 6 (ref 5.0–8.0)
Protein, ur: NEGATIVE mg/dL
SPECIFIC GRAVITY, URINE: 1.019 (ref 1.005–1.030)
Urobilinogen, UA: 0.2 mg/dL (ref 0.0–1.0)

## 2014-04-18 LAB — BASIC METABOLIC PANEL
BUN: 18 mg/dL (ref 6–23)
CO2: 27 mEq/L (ref 19–32)
CREATININE: 0.88 mg/dL (ref 0.50–1.35)
Calcium: 9.7 mg/dL (ref 8.4–10.5)
Chloride: 103 mEq/L (ref 96–112)
GLUCOSE: 112 mg/dL — AB (ref 70–99)
Potassium: 4.5 mEq/L (ref 3.7–5.3)
Sodium: 141 mEq/L (ref 137–147)

## 2014-04-18 LAB — CBC
HCT: 43.2 % (ref 39.0–52.0)
HEMOGLOBIN: 14.8 g/dL (ref 13.0–17.0)
MCH: 30.6 pg (ref 26.0–34.0)
MCHC: 34.3 g/dL (ref 30.0–36.0)
MCV: 89.3 fL (ref 78.0–100.0)
Platelets: 182 10*3/uL (ref 150–400)
RBC: 4.84 MIL/uL (ref 4.22–5.81)
RDW: 11.9 % (ref 11.5–15.5)
WBC: 6.2 10*3/uL (ref 4.0–10.5)

## 2014-04-18 LAB — URINE MICROSCOPIC-ADD ON

## 2014-04-18 LAB — SURGICAL PCR SCREEN
MRSA, PCR: NEGATIVE
STAPHYLOCOCCUS AUREUS: NEGATIVE

## 2014-04-18 LAB — PROTIME-INR
INR: 0.92 (ref 0.00–1.49)
PROTHROMBIN TIME: 12.4 s (ref 11.6–15.2)

## 2014-04-18 LAB — ABO/RH: ABO/RH(D): A POS

## 2014-04-18 NOTE — Patient Instructions (Addendum)
21 Jose Webb  04/18/2014   Your procedure is scheduled on: 7-9  -2015  Enter through [facility] and [facility]. Arrive at      0530  AM.  Call this number if you have problems the morning of surgery: [phone]  Or Presurgical Testing [phone]([place]) For Living Will and/or Health Care Power Attorney Forms: please provide copy for your medical record,may bring AM of surgery(Forms should be already notarized -we do not provide this service).(04-18-14 No information preferred today).     Do not eat food:After Midnight.   Take these medicines the morning of surgery with A SIP OF WATER: Omeprazole. Use Valium as needed.   Do not wear jewelry, make-up or nail polish.  Do not wear lotions, powders, or perfumes. You may wear deodorant.  Do not shave 48 hours(2 days) prior to first CHG shower(legs and under arms).(Shaving face and neck okay.)  Do not bring valuables to the hospital.(Hospital is not responsible for lost valuables).  Contacts, dentures or removable bridgework, body piercing, hair pins may not be worn into surgery.  Leave suitcase in the car. After surgery it may be brought to your room.  For patients admitted to the hospital, checkout time is 11:00 AM the day of discharge.(Restricted visitors-Any Persons displaying flu-like symptoms or illness).    Patients discharged the day of surgery will not be allowed to drive home. Must have responsible person with you x 24 hours once discharged.  Name and phone number of your driver: Clydie BraunKaren -spouse  [phone] Pt. cell  Special Instructions: CHG(Chlorhedine 4%-"Hibiclens","Betasept","Aplicare") Shower Use Special Wash: see special instructions.(avoid face and genitals)   Please read over the following fact sheets that you were given: MRSA Information, Blood Transfusion fact sheet, Incentive Spirometry Instruction.  Remember : Type/Screen "Blue armbands" - may not be removed once  applied(would result in being retested AM of surgery, if removed).     ___________________    [facility] - Preparing for Surgery Before surgery, you can play an important role.  Because skin is not sterile, your skin needs to be as free of germs as possible.  You can reduce the number of germs on your skin by washing with CHG (chlorahexidine gluconate) soap before surgery.  CHG is an antiseptic cleaner which kills germs and bonds with the skin to continue killing germs even after washing. Please DO NOT use if you have an allergy to CHG or antibacterial soaps.  If your skin becomes reddened/irritated stop using the CHG and inform your nurse when you arrive at Short Stay. Do not shave (including legs and underarms) for at least 48 hours prior to the first CHG shower.  You may shave your face/neck. Please follow these instructions carefully:  1.  Shower with CHG Soap the night before surgery and the  morning of Surgery.  2.  If you choose to wash your hair, wash your hair first as usual with your  normal  shampoo.  3.  After you shampoo, rinse your hair and body thoroughly to remove the  shampoo.                           4.  Use CHG as you would any other liquid soap.  You can apply chg directly  to the skin and wash  Gently with a scrungie or clean washcloth.  5.  Apply the CHG Soap to your body ONLY FROM THE NECK DOWN.   Do not use on face/ open                           Wound or open sores. Avoid contact with eyes, ears mouth and genitals (private parts).                       Wash face,  Genitals (private parts) with your normal soap.             6.  Wash thoroughly, paying special attention to the area where your surgery  will be performed.  7.  Thoroughly rinse your body with warm water from the neck down.  8.  DO NOT shower/wash with your normal soap after using and rinsing off  the CHG Soap.                9.  Pat yourself dry with a clean towel.            10.  Wear  clean pajamas.            11.  Place clean sheets on your bed the night of your first shower and do not  sleep with pets. Day of Surgery : Do not apply any lotions/deodorants the morning of surgery.  Please wear clean clothes to the hospital/surgery center.  FAILURE TO FOLLOW THESE INSTRUCTIONS MAY RESULT IN THE CANCELLATION OF YOUR SURGERY PATIENT SIGNATURE_________________________________  NURSE SIGNATURE__________________________________  ________________________________________________________________________   Adam Phenix  An incentive spirometer is a tool that can help keep your lungs clear and active. This tool measures how well you are filling your lungs with each breath. Taking long deep breaths may help reverse or decrease the chance of developing breathing (pulmonary) problems (especially infection) following:  A long period of time when you are unable to move or be active. BEFORE THE PROCEDURE   If the spirometer includes an indicator to show your best effort, your nurse or respiratory therapist will set it to a desired goal.  If possible, sit up straight or lean slightly forward. Try not to slouch.  Hold the incentive spirometer in an upright position. INSTRUCTIONS FOR USE  1. Sit on the edge of your bed if possible, or sit up as far as you can in bed or on a chair. 2. Hold the incentive spirometer in an upright position. 3. Breathe out normally. 4. Place the mouthpiece in your mouth and seal your lips tightly around it. 5. Breathe in slowly and as deeply as possible, raising the piston or the ball toward the top of the column. 6. Hold your breath for 3-5 seconds or for as long as possible. Allow the piston or ball to fall to the bottom of the column. 7. Remove the mouthpiece from your mouth and breathe out normally. 8. Rest for a few seconds and repeat Steps 1 through 7 at least 10 times every 1-2 hours when you are awake. Take your time and take a few normal  breaths between deep breaths. 9. The spirometer may include an indicator to show your best effort. Use the indicator as a goal to work toward during each repetition. 10. After each set of 10 deep breaths, practice coughing to be sure your lungs are clear. If you have an incision (the cut made at the time of  surgery), support your incision when coughing by placing a pillow or rolled up towels firmly against it. Once you are able to get out of bed, walk around indoors and cough well. You may stop using the incentive spirometer when instructed by your caregiver.  RISKS AND COMPLICATIONS  Take your time so you do not get dizzy or light-headed.  If you are in pain, you may need to take or ask for pain medication before doing incentive spirometry. It is harder to take a deep breath if you are having pain. AFTER USE  Rest and breathe slowly and easily.  It can be helpful to keep track of a log of your progress. Your caregiver can provide you with a simple table to help with this. If you are using the spirometer at home, follow these instructions: [place] IF:   You are having difficultly using the spirometer.  You have trouble using the spirometer as often as instructed.  Your pain medication is not giving enough relief while using the spirometer.  You develop fever of 100.5 F (38.1 C) or higher. SEEK IMMEDIATE MEDICAL CARE IF:   You cough up bloody sputum that had not been present before.  You develop fever of 102 F (38.9 C) or greater.  You develop worsening pain at or near the incision site. MAKE SURE YOU:   Understand these instructions.  Will watch your condition.  Will get help right away if you are not doing well or get worse. Document Released: 02/16/2007 Document Revised: 12/29/2011 Document Reviewed: 04/19/2007 ExitCare Patient Information 2014 ExitCare, [place].   ________________________________________________________________________  WHAT IS A BLOOD  TRANSFUSION? Blood Transfusion Information  A transfusion is the replacement of blood or some of its parts. Blood is made up of multiple cells which provide different functions.  Red blood cells carry oxygen and are used for blood loss replacement.  White blood cells fight against infection.  Platelets control bleeding.  Plasma helps clot blood.  Other blood products are available for specialized needs, such as hemophilia or other clotting disorders. BEFORE THE TRANSFUSION  Who gives blood for transfusions?   Healthy volunteers who are fully evaluated to make sure their blood is safe. This is blood bank blood. Transfusion therapy is the safest it has ever been in the practice of medicine. Before blood is taken from a donor, a complete history is taken to make sure that person has no history of diseases nor engages in risky social behavior (examples are intravenous drug use or sexual activity with multiple partners). The donor's travel history is screened to minimize risk of transmitting infections, such as malaria. The donated blood is tested for signs of infectious diseases, such as HIV and hepatitis. The blood is then tested to be sure it is compatible with you in order to minimize the chance of a transfusion reaction. If you or a relative donates blood, this is often done in anticipation of surgery and is not appropriate for emergency situations. It takes many days to process the donated blood. RISKS AND COMPLICATIONS Although transfusion therapy is very safe and saves many lives, the main dangers of transfusion include:   Getting an infectious disease.  Developing a transfusion reaction. This is an allergic reaction to something in the blood you were given. Every precaution is taken to prevent this. The decision to have a blood transfusion has been considered carefully by your caregiver before blood is given. Blood is not given unless the benefits outweigh the risks. AFTER THE  TRANSFUSION  Right after receiving a blood transfusion, you will usually feel much better and more energetic. This is especially true if your red blood cells have gotten low (anemic). The transfusion raises the level of the red blood cells which carry oxygen, and this usually causes an energy increase.  The nurse administering the transfusion will monitor you carefully for complications. HOME CARE INSTRUCTIONS  No special instructions are needed after a transfusion. You may find your energy is better. Speak with your caregiver about any limitations on activity for underlying diseases you may have. SEEK MEDICAL CARE IF:   Your condition is not improving after your transfusion.  You develop redness or irritation at the intravenous (IV) site. SEEK IMMEDIATE MEDICAL CARE IF:  Any of the following symptoms occur over the next 12 hours:  Shaking chills.  You have a temperature by mouth above 102 F (38.9 C), not controlled by medicine.  Chest, back, or muscle pain.  People around you feel you are not acting correctly or are confused.  Shortness of breath or difficulty breathing.  Dizziness and fainting.  You get a rash or develop hives.  You have a decrease in urine output.  Your urine turns a dark color or changes to pink, red, or brown. Any of the following symptoms occur over the next 10 days:  You have a temperature by mouth above 102 F (38.9 C), not controlled by medicine.  Shortness of breath.  Weakness after normal activity.  The white part of the eye turns yellow (jaundice).  You have a decrease in the amount of urine or are urinating less often.  Your urine turns a dark color or changes to pink, red, or brown. Document Released: 10/03/2000 Document Revised: 12/29/2011 Document Reviewed: 05/22/2008 [facility] Patient Information 2014 [place], [place].  _______________________________________________________________________

## 2014-04-18 NOTE — Progress Notes (Signed)
Your Pt has screened with an elevated risk for obstructive sleep apnea using the Stop-Bang tool during a presurgical  Visit. A score of four or greater is an elevated risk. 

## 2014-04-18 NOTE — H&P (Signed)
Jose Webb DOB: [birth year] Single / Language: [place] / Race: American [place] or [place], [place]  H&P date: 04/18/14  Chief Complaint: Right knee pain  History of Present Illness The patient is a 58 year old male who comes in today for a preoperative History and Physical. The patient is scheduled for a right total knee arthroplasty to be performed by Dr. Javier DockerJeffrey C. Beane, [facility] on 04/27/2014 . Please see the hospital record for complete dictated history and physical. Note for "H & P": Pt has failed conservative tx. Proceed with total knee. We mutually agreed to proceed with a total knee replacement. Risks and benefits of the procedure were discussed including stiffness, suboptimal range of motion, persistent pain, infection requiring removal of prosthesis and reinsertion, need for prophylactic antibiotics in the future, or example: dental procedures, possible need for manipulation, revision in the future and also anesthetic complications including DVT, PE, etc. We discussed the perioperative course, time in the hospital, postoperative recovery, and the need for elevation to control swelling. We also discussed the predicted range of motion and the probability that squatting and kneeling would be unobtainable in the future. In addition, postoperative anticoagulation was discussed. We have obtained preoperative medical clearance as necessary. Provided the patient with illustrated handouts and discussed it in detail. They will enroll in the total joint replacement educational forum at the hospital. Clearance was obtained by Dr. Caryl NeverBurchette. His tick bite has resolved. He is off doxy.  Allergies OxyCODONE HCl *ANALGESICS - OPIOID*. Itching, Rash.  Family History Heart Disease. First Degree Relatives. father Diabetes Mellitus. father Rheumatoid Arthritis. mother Hypertension. mother and father Congestive Heart Failure. First Degree  Relatives. father Chronic Obstructive Lung Disease. father  Social History Alcohol use. current drinker; drinks beer; 8-14 per week Tobacco use. Never smoker. never smoker Tobacco / smoke exposure. yes Marital status. married Exercise. Exercises weekly; does running / walking Children. 1 Drug/Alcohol Rehab (Previously). no Pain Contract. no Number of flights of stairs before winded. greater than 5 Current work status. working full time Illicit drug use. no Living situation. live with spouse; one level home with 2 steps to enter Drug/Alcohol Rehab (Currently). no Post-Surgical Plans. home with HHPT Advance Directives. none  Medication History Omeprazole (20MG  Capsule DR, Oral) Active. Percocet (7.5-325MG  Tablet, Oral) Active. Diazepam (5MG  Tablet, Oral) Active. Losartan Potassium (100MG  Tablet, Oral) Active. Fish Oil Concentrate (1000MG  Capsule, 1 (one) Oral) Active. Medications Reconciled.  Past Surgical History Spinal Surgery  Past Medical Hx Gastroesophageal Reflux Disease High blood pressure Asthma Chronic Pain Cervical radiculitis Neck pain DDD (degenerative disc disease) Tinnitus Impaired Hearing  Review of Systems General:Not Present- Chills, Fever, Night Sweats, Fatigue, Weight Gain, Weight Loss and Memory Loss. Skin:Present- Itching and Rash. Not Present- Hives, Eczema and Lesions. HEENT:Present- Tinnitus and Hearing Loss. Not Present- Headache, Double Vision, Visual Loss and Dentures. Respiratory:Not Present- Shortness of breath with exertion, Shortness of breath at rest, Allergies, Coughing up blood and Chronic Cough. Cardiovascular:Not Present- Chest Pain, Racing/skipping heartbeats, Difficulty Breathing Lying Down, Murmur, Swelling and Palpitations. Gastrointestinal:Not Present- Bloody Stool, Heartburn, Abdominal Pain, Vomiting, Nausea, Constipation, Diarrhea, Difficulty Swallowing, Jaundice and Loss of appetitie. Male  Genitourinary:Not Present- Urinary frequency, Blood in Urine, Weak urinary stream, Discharge, Flank Pain, Incontinence, Painful Urination, Urgency, Urinary Retention and Urinating at Night. Musculoskeletal:Present- Joint Pain and Morning Stiffness. Not Present- Muscle Weakness, Muscle Pain, Back Pain and Spasms. Neurological:Not Present- Tremor, Dizziness, Blackout spells, Paralysis, Difficulty with balance and Weakness. Psychiatric:Present- Insomnia.  Vitals 04/18/2014 9:03 AM Weight: 229 lb Height: 72 in Body Surface Area: 2.3 m Body Mass Index: 31.06 kg/m Pulse: 89 (Regular) BP: 164/99 (Sitting, Left Arm, Standard)  Physical Exam The physical exam findings are as follows:  General Mental Status - Alert, cooperative and good historian. General Appearance- pleasant. Not in acute distress. Orientation- Oriented X3. Build & Nutrition- Well nourished and Well developed. Gait- Antalgic.  Head and Neck Head- normocephalic, atraumatic . Neck Global Assessment- supple. no bruit auscultated on the right and no bruit auscultated on the left.  Eye Pupil- Bilateral- Regular and Round. Motion- Bilateral- EOMI.  Chest and Lung Exam Auscultation: Breath sounds:- clear at anterior chest wall and - clear at posterior chest wall. Adventitious sounds:- No Adventitious sounds.  Cardiovascular Auscultation:Rhythm- Regular rate and rhythm. Heart Sounds- S1 WNL and S2 WNL. Murmurs & Other Heart Sounds:Auscultation of the heart reveals - No Murmurs.  Abdomen Palpation/Percussion:Tenderness- Abdomen is non-tender to palpation. Rigidity (guarding)- Abdomen is soft. Auscultation:Auscultation of the abdomen reveals - Bowel sounds normal.  Male Genitourinary Not done, not pertinent to present illness  Musculoskeletal On exam walks with an antalgic gait. Varus stress, -5-110. Exquisitely tender over the medial joint line. Trace effusion. Knee exam on  inspection reveals no evidence of soft tissue swelling, ecchymosis, deformity or erythema. On palpation there is no tenderness in the lateral joint line. No patellofemoral pain with compression. Nontender over the fibular head or the peroneal nerve. Nontender over the quadriceps insertion of the patellar ligament insertion. Provocative maneuvers revealed a negative Lachman, negative anterior and posterior drawer, and negative McMurray. No instability was noted with valgus stressing at 0 or 30 degrees. On manual motor test the quadriceps and hamstrings were 5/5. Sensory exam was intact to light touch.  Imaging AP standing a lateral demonstrate end stage bone-on-bone arthrosis.  Assessment & Plan Post-traumatic osteoarthritis of right knee   Pt with end-stage R knee DJD refractory to conservative tx. Scheduled for TKA by Dr. Shelle IronBeane on 04/27/14. He has been cleared by his PCP. We again discussed surgery itself as well as risks, complications and alternatives, including but not limited to DVT, PE, infx, bleeding, failure of procedure, need for secondary procedure including manipulation, nerve injury, ongoing pain/symptoms, anesthesia risk, even stroke or death. Also discussed typical post-op protocols, activity restrictions, need for PT, flexion/extension exercises, time out of work. Discussed need for DVT ppx post-op with Xarelto then ASA per protocol. Discussed dental ppx. Also discussed limitations post-operatively such as kneeling and squatting. All questions were answered. Patient desires to proceed with surgery.  He will remain NPO after MN the night before surgery. He will hold ASA, NSAIDs, supplements accordingly. Discussed continued Percocet in the interim, add benadryl prn for itching. May need to send him home on PO dilaudid if he continues to have itching with Percocet, or could consider Norco 10mg . Discussed Xarelto as DVT ppx. Plan home with HHPT post-op. He has his [facility] appt today. He will follow up  10-14 days post-op for staple removal and xrays. He will remain out of work.   Plan R total knee replacement  Signed electronically by Dorothy SparkJaclyn M Bissell, PA-C for Dr. Shelle IronBeane

## 2014-04-18 NOTE — Pre-Procedure Instructions (Signed)
04-18-14 EKG / CXR done today.

## 2014-04-26 NOTE — Anesthesia Preprocedure Evaluation (Addendum)
Anesthesia Evaluation  Patient identified by MRN, date of birth, ID band Patient awake    Reviewed: Allergy & Precautions, H&P , NPO status , Patient's Chart, lab work & pertinent test results  Airway Mallampati: II TM Distance: >3 FB Neck ROM: Full    Dental no notable dental hx.    Pulmonary asthma ,  breath sounds clear to auscultation  Pulmonary exam normal       Cardiovascular Exercise Tolerance: Good hypertension, Pt. on medications negative cardio ROS  Rhythm:Regular Rate:Normal     Neuro/Psych Anxiety Depression negative neurological ROS     GI/Hepatic Neg liver ROS, GERD-  Medicated,  Endo/Other  negative endocrine ROS  Renal/GU negative Renal ROS  negative genitourinary   Musculoskeletal negative musculoskeletal ROS (+)   Abdominal (+) + obese,   Peds negative pediatric ROS (+)  Hematology negative hematology ROS (+)   Anesthesia Other Findings   Reproductive/Obstetrics negative OB ROS                         Anesthesia Physical Anesthesia Plan  ASA: II  Anesthesia Plan: General   Post-op Pain Management:    Induction: Intravenous  Airway Management Planned: Oral ETT  Additional Equipment:   Intra-op Plan:   Post-operative Plan: Extubation in OR  Informed Consent: I have reviewed the patients History and Physical, chart, labs and discussed the procedure including the risks, benefits and alternatives for the proposed anesthesia with the patient or authorized representative who has indicated his/her understanding and acceptance.   Dental advisory given  Plan Discussed with: CRNA  Anesthesia Plan Comments: (Discussed general and spinal. S/P 2 back surgeries. He prefers general.)       Anesthesia Quick Evaluation

## 2014-04-27 ENCOUNTER — Inpatient Hospital Stay (HOSPITAL_COMMUNITY): Payer: No Typology Code available for payment source

## 2014-04-27 ENCOUNTER — Encounter (HOSPITAL_COMMUNITY): Payer: Self-pay | Admitting: *Deleted

## 2014-04-27 ENCOUNTER — Inpatient Hospital Stay (HOSPITAL_COMMUNITY)
Admission: RE | Admit: 2014-04-27 | Discharge: 2014-05-01 | DRG: 470 | Disposition: A | Discharge status: Home Health | Payer: No Typology Code available for payment source | Source: Ambulatory Visit | Attending: Specialist | Admitting: Specialist

## 2014-04-27 ENCOUNTER — Inpatient Hospital Stay (HOSPITAL_COMMUNITY): Payer: No Typology Code available for payment source | Admitting: Anesthesiology

## 2014-04-27 ENCOUNTER — Encounter (HOSPITAL_COMMUNITY)
Admission: RE | Disposition: A | Discharge status: Home Health | Payer: Self-pay | Source: Ambulatory Visit | Attending: Specialist

## 2014-04-27 ENCOUNTER — Encounter (HOSPITAL_COMMUNITY): Payer: No Typology Code available for payment source | Admitting: Anesthesiology

## 2014-04-27 DIAGNOSIS — H9190 Unspecified hearing loss, unspecified ear: Secondary | ICD-10-CM | POA: Diagnosis present

## 2014-04-27 DIAGNOSIS — M1711 Unilateral primary osteoarthritis, right knee: Secondary | ICD-10-CM

## 2014-04-27 DIAGNOSIS — F3289 Other specified depressive episodes: Secondary | ICD-10-CM | POA: Diagnosis present

## 2014-04-27 DIAGNOSIS — J45909 Unspecified asthma, uncomplicated: Secondary | ICD-10-CM | POA: Diagnosis present

## 2014-04-27 DIAGNOSIS — I10 Essential (primary) hypertension: Secondary | ICD-10-CM | POA: Diagnosis present

## 2014-04-27 DIAGNOSIS — Z8249 Family history of ischemic heart disease and other diseases of the circulatory system: Secondary | ICD-10-CM

## 2014-04-27 DIAGNOSIS — Z6831 Body mass index (BMI) 31.0-31.9, adult: Secondary | ICD-10-CM

## 2014-04-27 DIAGNOSIS — H9319 Tinnitus, unspecified ear: Secondary | ICD-10-CM | POA: Diagnosis present

## 2014-04-27 DIAGNOSIS — F411 Generalized anxiety disorder: Secondary | ICD-10-CM | POA: Diagnosis present

## 2014-04-27 DIAGNOSIS — E669 Obesity, unspecified: Secondary | ICD-10-CM | POA: Diagnosis present

## 2014-04-27 DIAGNOSIS — M1710 Unilateral primary osteoarthritis, unspecified knee: Principal | ICD-10-CM | POA: Diagnosis present

## 2014-04-27 DIAGNOSIS — K219 Gastro-esophageal reflux disease without esophagitis: Secondary | ICD-10-CM | POA: Diagnosis present

## 2014-04-27 DIAGNOSIS — F329 Major depressive disorder, single episode, unspecified: Secondary | ICD-10-CM | POA: Diagnosis present

## 2014-04-27 HISTORY — PX: TOTAL KNEE ARTHROPLASTY: SHX125

## 2014-04-27 LAB — TYPE AND SCREEN
ABO/RH(D): A POS
Antibody Screen: NEGATIVE

## 2014-04-27 SURGERY — ARTHROPLASTY, KNEE, TOTAL
Anesthesia: General | Site: Knee | Laterality: Right

## 2014-04-27 MED ORDER — ADULT MULTIVITAMIN W/MINERALS CH
1.0000 | ORAL_TABLET | Freq: Every day | ORAL | Status: DC
Start: 2014-04-27 — End: 2014-05-01
  Administered 2014-04-27 – 2014-05-01 (×5): 1 via ORAL
  Filled 2014-04-27 (×5): qty 1

## 2014-04-27 MED ORDER — HYDROMORPHONE HCL PF 1 MG/ML IJ SOLN
INTRAMUSCULAR | Status: AC
  Filled 2014-04-27: qty 1

## 2014-04-27 MED ORDER — GLYCOPYRROLATE 0.2 MG/ML IJ SOLN
INTRAMUSCULAR | Status: AC
  Filled 2014-04-27: qty 3

## 2014-04-27 MED ORDER — DEXTROSE 5 % IV SOLN
500.0000 mg | Freq: Four times a day (QID) | INTRAVENOUS | Status: DC | PRN
  Administered 2014-04-27: 500 mg via INTRAVENOUS
  Filled 2014-04-27: qty 5

## 2014-04-27 MED ORDER — HYDROMORPHONE HCL PF 2 MG/ML IJ SOLN
INTRAMUSCULAR | Status: AC
  Filled 2014-04-27: qty 1

## 2014-04-27 MED ORDER — DIPHENHYDRAMINE HCL 50 MG/ML IJ SOLN
25.0000 mg | Freq: Once | INTRAMUSCULAR | Status: AC
  Administered 2014-04-27: 25 mg via INTRAVENOUS

## 2014-04-27 MED ORDER — VITAMIN C 500 MG PO TABS
1000.0000 mg | ORAL_TABLET | Freq: Every day | ORAL | Status: DC
  Administered 2014-04-27 – 2014-05-01 (×5): 1000 mg via ORAL
  Filled 2014-04-27 (×5): qty 2

## 2014-04-27 MED ORDER — HYDRALAZINE HCL 20 MG/ML IJ SOLN
INTRAMUSCULAR | Status: AC
  Filled 2014-04-27: qty 1

## 2014-04-27 MED ORDER — MIDAZOLAM HCL 5 MG/5ML IJ SOLN
INTRAMUSCULAR | Status: DC | PRN
  Administered 2014-04-27: 2 mg via INTRAVENOUS

## 2014-04-27 MED ORDER — KCL IN DEXTROSE-NACL 20-5-0.45 MEQ/L-%-% IV SOLN
INTRAVENOUS | Status: AC
  Administered 2014-04-27: 15:00:00 via INTRAVENOUS
  Administered 2014-04-28: 75 mL/h via INTRAVENOUS
  Filled 2014-04-27 (×2): qty 1000

## 2014-04-27 MED ORDER — MENTHOL 3 MG MT LOZG
1.0000 | LOZENGE | OROMUCOSAL | Status: DC | PRN
Start: 2014-04-27 — End: 2014-05-01

## 2014-04-27 MED ORDER — METHOCARBAMOL 500 MG PO TABS: 500.0000 mg | ORAL_TABLET | Freq: Three times a day (TID) | ORAL | Status: DC | PRN

## 2014-04-27 MED ORDER — SODIUM CHLORIDE 0.9 % IJ SOLN
INTRAMUSCULAR | Status: AC
  Filled 2014-04-27: qty 10

## 2014-04-27 MED ORDER — ONDANSETRON HCL 4 MG/2ML IJ SOLN
INTRAMUSCULAR | Status: AC
  Filled 2014-04-27: qty 2

## 2014-04-27 MED ORDER — PROMETHAZINE HCL 25 MG/ML IJ SOLN: 6.2500 mg | INTRAMUSCULAR | Status: DC | PRN

## 2014-04-27 MED ORDER — BUPIVACAINE LIPOSOME 1.3 % IJ SUSP
20.0000 mL | Freq: Once | INTRAMUSCULAR | Status: AC
  Administered 2014-04-27: 20 mL
  Filled 2014-04-27: qty 20

## 2014-04-27 MED ORDER — HYDROMORPHONE HCL 2 MG PO TABS
4.0000 mg | ORAL_TABLET | ORAL | Status: DC | PRN
  Administered 2014-04-27 – 2014-05-01 (×19): 4 mg via ORAL
  Filled 2014-04-27 (×20): qty 2

## 2014-04-27 MED ORDER — LABETALOL HCL 5 MG/ML IV SOLN
INTRAVENOUS | Status: AC
  Filled 2014-04-27: qty 4

## 2014-04-27 MED ORDER — HYDROMORPHONE HCL PF 1 MG/ML IJ SOLN
0.2500 mg | INTRAMUSCULAR | Status: DC | PRN
  Administered 2014-04-27 (×4): 0.5 mg via INTRAVENOUS

## 2014-04-27 MED ORDER — ACETAMINOPHEN 650 MG RE SUPP: 650.0000 mg | Freq: Four times a day (QID) | RECTAL | Status: DC | PRN

## 2014-04-27 MED ORDER — LABETALOL HCL 5 MG/ML IV SOLN
INTRAVENOUS | Status: DC | PRN
  Administered 2014-04-27: 5 mg via INTRAVENOUS
  Administered 2014-04-27: 2.5 mg via INTRAVENOUS

## 2014-04-27 MED ORDER — RIVAROXABAN 10 MG PO TABS: 10.0000 mg | ORAL_TABLET | Freq: Every day | ORAL | Status: DC

## 2014-04-27 MED ORDER — PHENOL 1.4 % MT LIQD: 1.0000 | OROMUCOSAL | Status: DC | PRN

## 2014-04-27 MED ORDER — DIPHENHYDRAMINE HCL 50 MG/ML IJ SOLN
INTRAMUSCULAR | Status: AC
  Filled 2014-04-27: qty 1

## 2014-04-27 MED ORDER — LABETALOL HCL 5 MG/ML IV SOLN
2.5000 mg | Freq: Once | INTRAVENOUS | Status: AC
  Administered 2014-04-27: 2.5 mg via INTRAVENOUS

## 2014-04-27 MED ORDER — HYDROMORPHONE HCL PF 1 MG/ML IJ SOLN
INTRAMUSCULAR | Status: DC | PRN
  Administered 2014-04-27 (×4): 0.5 mg via INTRAVENOUS

## 2014-04-27 MED ORDER — FENTANYL CITRATE 0.05 MG/ML IJ SOLN
INTRAMUSCULAR | Status: AC
  Filled 2014-04-27: qty 2

## 2014-04-27 MED ORDER — PROPOFOL 10 MG/ML IV BOLUS
INTRAVENOUS | Status: AC
  Filled 2014-04-27: qty 20

## 2014-04-27 MED ORDER — MIDAZOLAM HCL 2 MG/2ML IJ SOLN
0.5000 mg | Freq: Once | INTRAMUSCULAR | Status: AC
  Administered 2014-04-27: 0.5 mg via INTRAVENOUS

## 2014-04-27 MED ORDER — DOCUSATE SODIUM 100 MG PO CAPS: 100.0000 mg | ORAL_CAPSULE | Freq: Two times a day (BID) | ORAL | Status: DC | PRN

## 2014-04-27 MED ORDER — HYDRALAZINE HCL 20 MG/ML IJ SOLN
5.0000 mg | Freq: Once | INTRAMUSCULAR | Status: AC
  Administered 2014-04-27: 5 mg via INTRAVENOUS

## 2014-04-27 MED ORDER — PROPOFOL 10 MG/ML IV BOLUS
INTRAVENOUS | Status: DC | PRN
  Administered 2014-04-27: 230 mg via INTRAVENOUS

## 2014-04-27 MED ORDER — ROCURONIUM BROMIDE 100 MG/10ML IV SOLN
INTRAVENOUS | Status: DC | PRN
Start: 2014-04-27 — End: 2014-04-27
  Administered 2014-04-27: 20 mg via INTRAVENOUS

## 2014-04-27 MED ORDER — EPHEDRINE SULFATE 50 MG/ML IJ SOLN
INTRAMUSCULAR | Status: AC
  Filled 2014-04-27: qty 1

## 2014-04-27 MED ORDER — NEOSTIGMINE METHYLSULFATE 10 MG/10ML IV SOLN
INTRAVENOUS | Status: DC | PRN
  Administered 2014-04-27: 4 mg via INTRAVENOUS

## 2014-04-27 MED ORDER — METOCLOPRAMIDE HCL 10 MG PO TABS: 5.0000 mg | ORAL_TABLET | Freq: Three times a day (TID) | ORAL | Status: DC | PRN

## 2014-04-27 MED ORDER — HYDROMORPHONE HCL PF 1 MG/ML IJ SOLN
0.5000 mg | INTRAMUSCULAR | Status: DC | PRN
  Administered 2014-04-27 (×2): 0.5 mg via INTRAVENOUS

## 2014-04-27 MED ORDER — LIDOCAINE HCL (CARDIAC) 20 MG/ML IV SOLN
INTRAVENOUS | Status: DC | PRN
  Administered 2014-04-27: 100 mg via INTRAVENOUS

## 2014-04-27 MED ORDER — ONDANSETRON HCL 4 MG/2ML IJ SOLN
INTRAMUSCULAR | Status: DC | PRN
  Administered 2014-04-27: 4 mg via INTRAVENOUS

## 2014-04-27 MED ORDER — GLYCOPYRROLATE 0.2 MG/ML IJ SOLN
INTRAMUSCULAR | Status: DC | PRN
  Administered 2014-04-27: .6 mg via INTRAVENOUS

## 2014-04-27 MED ORDER — FENTANYL CITRATE 0.05 MG/ML IJ SOLN
25.0000 ug | INTRAMUSCULAR | Status: DC | PRN
  Administered 2014-04-27 (×2): 25 ug via INTRAVENOUS

## 2014-04-27 MED ORDER — RIVAROXABAN 10 MG PO TABS
10.0000 mg | ORAL_TABLET | Freq: Every day | ORAL | Status: DC
  Administered 2014-04-28 – 2014-05-01 (×4): 10 mg via ORAL
  Filled 2014-04-27 (×5): qty 1

## 2014-04-27 MED ORDER — FENTANYL CITRATE 0.05 MG/ML IJ SOLN
INTRAMUSCULAR | Status: AC
  Filled 2014-04-27: qty 5

## 2014-04-27 MED ORDER — NEOSTIGMINE METHYLSULFATE 10 MG/10ML IV SOLN
INTRAVENOUS | Status: AC
  Filled 2014-04-27: qty 1

## 2014-04-27 MED ORDER — METOCLOPRAMIDE HCL 5 MG/ML IJ SOLN: 5.0000 mg | Freq: Three times a day (TID) | INTRAMUSCULAR | Status: DC | PRN

## 2014-04-27 MED ORDER — DOCUSATE SODIUM 100 MG PO CAPS
100.0000 mg | ORAL_CAPSULE | Freq: Two times a day (BID) | ORAL | Status: DC
  Administered 2014-04-27 – 2014-05-01 (×8): 100 mg via ORAL

## 2014-04-27 MED ORDER — MIDAZOLAM HCL 2 MG/2ML IJ SOLN
INTRAMUSCULAR | Status: AC
  Filled 2014-04-27: qty 2

## 2014-04-27 MED ORDER — KETOROLAC TROMETHAMINE 30 MG/ML IJ SOLN
30.0000 mg | Freq: Once | INTRAMUSCULAR | Status: AC
  Administered 2014-04-27: 30 mg via INTRAVENOUS

## 2014-04-27 MED ORDER — FENTANYL CITRATE 0.05 MG/ML IJ SOLN
INTRAMUSCULAR | Status: DC | PRN
  Administered 2014-04-27: 50 ug via INTRAVENOUS
  Administered 2014-04-27: 100 ug via INTRAVENOUS
  Administered 2014-04-27 (×4): 50 ug via INTRAVENOUS
  Administered 2014-04-27: 100 ug via INTRAVENOUS

## 2014-04-27 MED ORDER — CEFAZOLIN SODIUM-DEXTROSE 2-3 GM-% IV SOLR
INTRAVENOUS | Status: AC
  Filled 2014-04-27: qty 50

## 2014-04-27 MED ORDER — ONDANSETRON HCL 4 MG/2ML IJ SOLN: 4.0000 mg | Freq: Four times a day (QID) | INTRAMUSCULAR | Status: DC | PRN

## 2014-04-27 MED ORDER — METHOCARBAMOL 500 MG PO TABS
500.0000 mg | ORAL_TABLET | Freq: Four times a day (QID) | ORAL | Status: DC | PRN
  Administered 2014-04-27 – 2014-04-30 (×8): 500 mg via ORAL
  Filled 2014-04-27 (×8): qty 1

## 2014-04-27 MED ORDER — CEFAZOLIN SODIUM-DEXTROSE 2-3 GM-% IV SOLR
2.0000 g | INTRAVENOUS | Status: AC
  Administered 2014-04-27: 2 g via INTRAVENOUS

## 2014-04-27 MED ORDER — LACTATED RINGERS IV SOLN
INTRAVENOUS | Status: DC
  Administered 2014-04-27: 07:00:00 via INTRAVENOUS

## 2014-04-27 MED ORDER — ACETAMINOPHEN 325 MG PO TABS
650.0000 mg | ORAL_TABLET | Freq: Four times a day (QID) | ORAL | Status: DC | PRN
  Administered 2014-04-29 – 2014-04-30 (×2): 650 mg via ORAL
  Filled 2014-04-27 (×2): qty 2

## 2014-04-27 MED ORDER — OXYCODONE-ACETAMINOPHEN 7.5-325 MG PO TABS: 1.0000 | ORAL_TABLET | ORAL | Status: DC | PRN

## 2014-04-27 MED ORDER — LORAZEPAM 2 MG/ML IJ SOLN: 1.0000 mg | Freq: Four times a day (QID) | INTRAMUSCULAR | Status: DC | PRN

## 2014-04-27 MED ORDER — ACETAMINOPHEN 10 MG/ML IV SOLN
1000.0000 mg | Freq: Four times a day (QID) | INTRAVENOUS | Status: DC
  Administered 2014-04-27: 1000 mg via INTRAVENOUS
  Filled 2014-04-27 (×4): qty 100

## 2014-04-27 MED ORDER — ONDANSETRON HCL 4 MG PO TABS: 4.0000 mg | ORAL_TABLET | Freq: Four times a day (QID) | ORAL | Status: DC | PRN

## 2014-04-27 MED ORDER — DIAZEPAM 5 MG PO TABS
5.0000 mg | ORAL_TABLET | Freq: Every evening | ORAL | Status: DC | PRN
  Administered 2014-04-27 – 2014-04-30 (×4): 5 mg via ORAL
  Filled 2014-04-27 (×5): qty 1

## 2014-04-27 MED ORDER — LIDOCAINE HCL (CARDIAC) 20 MG/ML IV SOLN
INTRAVENOUS | Status: AC
  Filled 2014-04-27: qty 5

## 2014-04-27 MED ORDER — SODIUM CHLORIDE 0.9 % IR SOLN
Status: DC | PRN
  Administered 2014-04-27: 1000 mL

## 2014-04-27 MED ORDER — MIDAZOLAM HCL 2 MG/2ML IJ SOLN
1.0000 mg | INTRAMUSCULAR | Status: AC | PRN
  Administered 2014-04-27 (×2): 1 mg via INTRAVENOUS

## 2014-04-27 MED ORDER — CEFAZOLIN SODIUM-DEXTROSE 2-3 GM-% IV SOLR
2.0000 g | Freq: Four times a day (QID) | INTRAVENOUS | Status: AC
  Administered 2014-04-27 (×2): 2 g via INTRAVENOUS
  Filled 2014-04-27 (×2): qty 50

## 2014-04-27 MED ORDER — SODIUM CHLORIDE 0.9 % IR SOLN
Status: DC | PRN
  Administered 2014-04-27: 09:00:00

## 2014-04-27 MED ORDER — HYDROMORPHONE HCL PF 1 MG/ML IJ SOLN
1.0000 mg | INTRAMUSCULAR | Status: DC | PRN
  Administered 2014-04-27 – 2014-04-29 (×7): 1 mg via INTRAVENOUS
  Filled 2014-04-27 (×7): qty 1

## 2014-04-27 MED ORDER — ROCURONIUM BROMIDE 100 MG/10ML IV SOLN
INTRAVENOUS | Status: AC
  Filled 2014-04-27: qty 1

## 2014-04-27 MED ORDER — OXYCODONE HCL 5 MG PO TABS: 5.0000 mg | ORAL_TABLET | ORAL | Status: DC | PRN

## 2014-04-27 MED ORDER — LORAZEPAM 1 MG PO TABS
1.0000 mg | ORAL_TABLET | Freq: Four times a day (QID) | ORAL | Status: DC | PRN
  Administered 2014-04-27 – 2014-04-28 (×2): 1 mg via ORAL
  Administered 2014-04-29 – 2014-05-01 (×2): 2 mg via ORAL
  Filled 2014-04-27: qty 2
  Filled 2014-04-27: qty 1
  Filled 2014-04-27 (×2): qty 2
  Filled 2014-04-27: qty 1

## 2014-04-27 MED ORDER — SUCCINYLCHOLINE CHLORIDE 20 MG/ML IJ SOLN
INTRAMUSCULAR | Status: DC | PRN
  Administered 2014-04-27: 150 mg via INTRAVENOUS

## 2014-04-27 MED ORDER — KETOROLAC TROMETHAMINE 30 MG/ML IJ SOLN
INTRAMUSCULAR | Status: AC
  Filled 2014-04-27: qty 1

## 2014-04-27 MED ORDER — ATROPINE SULFATE 0.4 MG/ML IJ SOLN
INTRAMUSCULAR | Status: AC
  Filled 2014-04-27: qty 2

## 2014-04-27 MED ORDER — PANTOPRAZOLE SODIUM 40 MG PO TBEC
40.0000 mg | DELAYED_RELEASE_TABLET | Freq: Every day | ORAL | Status: DC
  Administered 2014-04-28 – 2014-05-01 (×4): 40 mg via ORAL
  Filled 2014-04-27 (×4): qty 1

## 2014-04-27 MED ORDER — DIPHENHYDRAMINE HCL 25 MG PO CAPS
25.0000 mg | ORAL_CAPSULE | Freq: Four times a day (QID) | ORAL | Status: DC | PRN
  Administered 2014-04-28: 25 mg via ORAL
  Filled 2014-04-27 (×2): qty 1

## 2014-04-27 MED ORDER — LOSARTAN POTASSIUM 50 MG PO TABS
100.0000 mg | ORAL_TABLET | Freq: Every morning | ORAL | Status: DC
  Administered 2014-04-27 – 2014-05-01 (×5): 100 mg via ORAL
  Filled 2014-04-27 (×5): qty 2

## 2014-04-27 SURGICAL SUPPLY — 67 items
BAG ZIPLOCK 12X15 (MISCELLANEOUS) IMPLANT
BANDAGE ELASTIC 4 VELCRO ST LF (GAUZE/BANDAGES/DRESSINGS) ×3 IMPLANT
BANDAGE ELASTIC 6 VELCRO ST LF (GAUZE/BANDAGES/DRESSINGS) ×3 IMPLANT
BANDAGE ESMARK 6X9 LF (GAUZE/BANDAGES/DRESSINGS) ×1 IMPLANT
BLADE SAG 18X100X1.27 (BLADE) ×3 IMPLANT
BLADE SAW SGTL 13.0X1.19X90.0M (BLADE) ×3 IMPLANT
BNDG ESMARK 6X9 LF (GAUZE/BANDAGES/DRESSINGS) ×3
CAPT RP KNEE ×3 IMPLANT
CEMENT HV SMART SET (Cement) ×6 IMPLANT
CHLORAPREP W/TINT 26ML (MISCELLANEOUS) IMPLANT
CLOSURE WOUND 1/2 X4 (GAUZE/BANDAGES/DRESSINGS)
CLOTH 2% CHLOROHEXIDINE 3PK (PERSONAL CARE ITEMS) ×3 IMPLANT
CUFF TOURN SGL QUICK 34 (TOURNIQUET CUFF) ×2
CUFF TRNQT CYL 34X4X40X1 (TOURNIQUET CUFF) ×1 IMPLANT
DRAPE LG THREE QUARTER DISP (DRAPES) ×6 IMPLANT
DRAPE ORTHO SPLIT 77X108 STRL (DRAPES) ×4
DRAPE POUCH INSTRU U-SHP 10X18 (DRAPES) ×3 IMPLANT
DRAPE SURG ORHT 6 SPLT 77X108 (DRAPES) ×2 IMPLANT
DRAPE U-SHAPE 47X51 STRL (DRAPES) ×3 IMPLANT
DRSG ADAPTIC 3X8 NADH LF (GAUZE/BANDAGES/DRESSINGS) ×3 IMPLANT
DRSG AQUACEL AG ADV 3.5X10 (GAUZE/BANDAGES/DRESSINGS) ×3 IMPLANT
DRSG PAD ABDOMINAL 8X10 ST (GAUZE/BANDAGES/DRESSINGS) IMPLANT
DRSG TEGADERM 4X4.75 (GAUZE/BANDAGES/DRESSINGS) ×3 IMPLANT
DURAPREP 26ML APPLICATOR (WOUND CARE) ×3 IMPLANT
ELECT REM PT RETURN 9FT ADLT (ELECTROSURGICAL) ×3
ELECTRODE REM PT RTRN 9FT ADLT (ELECTROSURGICAL) ×1 IMPLANT
EVACUATOR 1/8 PVC DRAIN (DRAIN) ×3 IMPLANT
FACESHIELD WRAPAROUND (MASK) ×15 IMPLANT
GAUZE SPONGE 2X2 8PLY STRL LF (GAUZE/BANDAGES/DRESSINGS) ×1 IMPLANT
GAUZE SPONGE 4X4 12PLY STRL (GAUZE/BANDAGES/DRESSINGS) ×3 IMPLANT
GLOVE BIOGEL PI IND STRL 7.5 (GLOVE) ×1 IMPLANT
GLOVE BIOGEL PI IND STRL 8 (GLOVE) ×1 IMPLANT
GLOVE BIOGEL PI INDICATOR 7.5 (GLOVE) ×2
GLOVE BIOGEL PI INDICATOR 8 (GLOVE) ×2
GLOVE SURG SS PI 7.5 STRL IVOR (GLOVE) ×3 IMPLANT
GLOVE SURG SS PI 8.0 STRL IVOR (GLOVE) ×6 IMPLANT
GOWN STRL REUS W/TWL XL LVL3 (GOWN DISPOSABLE) ×6 IMPLANT
HANDPIECE INTERPULSE COAX TIP (DISPOSABLE) ×2
IMMOBILIZER KNEE 20 (SOFTGOODS) ×6 IMPLANT
IMMOBILIZER KNEE 20 THIGH 36 (SOFTGOODS) ×1 IMPLANT
KIT BASIN OR (CUSTOM PROCEDURE TRAY) ×3 IMPLANT
MANIFOLD NEPTUNE II (INSTRUMENTS) ×3 IMPLANT
NEEDLE HYPO 22GX1.5 SAFETY (NEEDLE) ×3 IMPLANT
NS IRRIG 1000ML POUR BTL (IV SOLUTION) IMPLANT
PACK TOTAL JOINT (CUSTOM PROCEDURE TRAY) ×3 IMPLANT
PADDING CAST COTTON 6X4 STRL (CAST SUPPLIES) IMPLANT
POSITIONER SURGICAL ARM (MISCELLANEOUS) ×3 IMPLANT
SET HNDPC FAN SPRY TIP SCT (DISPOSABLE) ×1 IMPLANT
SPONGE GAUZE 2X2 STER 10/PKG (GAUZE/BANDAGES/DRESSINGS) ×2
SPONGE SURGIFOAM ABS GEL 100 (HEMOSTASIS) IMPLANT
STAPLER VISISTAT (STAPLE) ×3 IMPLANT
STRIP CLOSURE SKIN 1/2X4 (GAUZE/BANDAGES/DRESSINGS) IMPLANT
SUCTION FRAZIER 12FR DISP (SUCTIONS) ×3 IMPLANT
SUT BONE WAX W31G (SUTURE) IMPLANT
SUT MNCRL AB 4-0 PS2 18 (SUTURE) IMPLANT
SUT VIC AB 1 CT1 27 (SUTURE) ×2
SUT VIC AB 1 CT1 27XBRD ANTBC (SUTURE) ×1 IMPLANT
SUT VIC AB 2-0 CT1 27 (SUTURE) ×6
SUT VIC AB 2-0 CT1 TAPERPNT 27 (SUTURE) ×3 IMPLANT
SUT VLOC 180 0 24IN GS25 (SUTURE) ×3 IMPLANT
SYRINGE 20CC LL (MISCELLANEOUS) ×3 IMPLANT
TOWEL OR 17X26 10 PK STRL BLUE (TOWEL DISPOSABLE) ×3 IMPLANT
TOWER CARTRIDGE SMART MIX (DISPOSABLE) ×3 IMPLANT
TRAY FOLEY CATH 14FRSI W/METER (CATHETERS) IMPLANT
TRAY FOLEY METER SIL LF 16FR (CATHETERS) ×3 IMPLANT
WATER STERILE IRR 1500ML POUR (IV SOLUTION) ×3 IMPLANT
WRAP KNEE MAXI GEL POST OP (GAUZE/BANDAGES/DRESSINGS) ×3 IMPLANT

## 2014-04-27 NOTE — Anesthesia Postprocedure Evaluation (Addendum)
  Anesthesia Post-op Note  Patient: Jose FowlerSpurgeon T Webb  Procedure(s) Performed: Procedure(s) (LRB): RIGHT TOTAL KNEE ARTHROPLASTY (Right)  Patient Location: PACU  Anesthesia Type: General  Level of Consciousness: awake and alert   Airway and Oxygen Therapy: Patient Spontanous Breathing  Post-op Pain: mild  Post-op Assessment: Post-op Vital signs reviewed, Patient's Cardiovascular Status Stable, Respiratory Function Stable, Patent Airway and No signs of Nausea or vomiting  Last Vitals:  Filed Vitals:   04/27/14 1343  BP: 163/82  Pulse: 91  Temp: 36.4 C  Resp: 20    Post-op Vital Signs: stable   Complications: No apparent anesthesia complications. Pain difficult to control. Gave increased amounts of dilaudid, fentanyl, and also gave IV tylenol, IV toradol, versed. Medications titrated to maximum safe effect. Discussed with Dr. Shelle IronBeane and patient's wife.

## 2014-04-27 NOTE — Progress Notes (Signed)
Labetalol 2.5 mg IVP given as ordered for elevated blood pressures.

## 2014-04-27 NOTE — H&P (View-Only) (Signed)
Jose Webb DOB: [birth year] Single / Language: [place] / Race: American [place] or [place] Native, [place]  H&P date: 04/18/14  Chief Complaint: Right knee pain  History of Present Illness The patient is a 58 year old male who comes in today for a preoperative History and Physical. The patient is scheduled for a right total knee arthroplasty to be performed by Dr. Javier DockerJeffrey C. Beane, [facility] 04/27/2014 . Please see the hospital record for complete dictated history and physical. Note for "H & P": Pt has failed conservative tx. Proceed with total knee. We mutually agreed to proceed with a total knee replacement. Risks and benefits of the procedure were discussed including stiffness, suboptimal range of motion, persistent pain, infection requiring removal of prosthesis and reinsertion, need for prophylactic antibiotics in the future, or example: dental procedures, possible need for manipulation, revision in the future and also anesthetic complications including DVT, PE, etc. We discussed the perioperative course, time in the hospital, postoperative recovery, and the need for elevation to control swelling. We also discussed the predicted range of motion and the probability that squatting and kneeling would be unobtainable in the future. In addition, postoperative anticoagulation was discussed. We have obtained preoperative medical clearance as necessary. Provided the patient with illustrated handouts and discussed it in detail. They will enroll in the total joint replacement educational forum at the hospital. Clearance was obtained by Dr. Caryl NeverBurchette. His tick bite has resolved. He is off doxy.  Allergies OxyCODONE HCl *ANALGESICS - OPIOID*. Itching, Rash.  Family History Heart Disease. First Degree Relatives. father Diabetes Mellitus. father Rheumatoid Arthritis. mother Hypertension. mother and father Congestive Heart Failure. First Degree  Relatives. father Chronic Obstructive Lung Disease. father  Social History Alcohol use. current drinker; drinks beer; 8-14 per week Tobacco use. Never smoker. never smoker Tobacco / smoke exposure. yes Marital status. married Exercise. Exercises weekly; does running / walking Children. 1 Drug/Alcohol Rehab (Previously). no Pain Contract. no Number of flights of stairs before winded. greater than 5 Current work status. working full time Illicit drug use. no Living situation. live with spouse; one level home with 2 steps to enter Drug/Alcohol Rehab (Currently). no Post-Surgical Plans. home with HHPT Advance Directives. none  Medication History Omeprazole (20MG  Capsule DR, Oral) Active. Percocet (7.5-325MG  Tablet, Oral) Active. Diazepam (5MG  Tablet, Oral) Active. Losartan Potassium (100MG  Tablet, Oral) Active. Fish Oil Concentrate (1000MG  Capsule, 1 (one) Oral) Active. Medications Reconciled.  Past Surgical History Spinal Surgery  Past Medical Hx Gastroesophageal Reflux Disease High blood pressure Asthma Chronic Pain Cervical radiculitis Neck pain DDD (degenerative disc disease) Tinnitus Impaired Hearing  Review of Systems General:Not Present- Chills, Fever, Night Sweats, Fatigue, Weight Gain, Weight Loss and Memory Loss. Skin:Present- Itching and Rash. Not Present- Hives, Eczema and Lesions. HEENT:Present- Tinnitus and Hearing Loss. Not Present- Headache, Double Vision, Visual Loss and Dentures. Respiratory:Not Present- Shortness of breath with exertion, Shortness of breath at rest, Allergies, Coughing up blood and Chronic Cough. Cardiovascular:Not Present- Chest Pain, Racing/skipping heartbeats, Difficulty Breathing Lying Down, Murmur, Swelling and Palpitations. Gastrointestinal:Not Present- Bloody Stool, Heartburn, Abdominal Pain, Vomiting, Nausea, Constipation, Diarrhea, Difficulty Swallowing, Jaundice and Loss of appetitie. Male  Genitourinary:Not Present- Urinary frequency, Blood in Urine, Weak urinary stream, Discharge, Flank Pain, Incontinence, Painful Urination, Urgency, Urinary Retention and Urinating at Night. Musculoskeletal:Present- Joint Pain and Morning Stiffness. Not Present- Muscle Weakness, Muscle Pain, Back Pain and Spasms. Neurological:Not Present- Tremor, Dizziness, Blackout spells, Paralysis, Difficulty with balance and Weakness. Psychiatric:Present- Insomnia.  Vitals 04/18/2014 9:03 AM Weight: 229 lb Height: 72 in Body Surface Area: 2.3 m Body Mass Index: 31.06 kg/m Pulse: 89 (Regular) BP: 164/99 (Sitting, Left Arm, Standard)  Physical Exam The physical exam findings are as follows:  General Mental Status - Alert, cooperative and good historian. General Appearance- pleasant. Not in acute distress. Orientation- Oriented X3. Build & Nutrition- Well nourished and Well developed. Gait- Antalgic.  Head and Neck Head- normocephalic, atraumatic . Neck Global Assessment- supple. no bruit auscultated on the right and no bruit auscultated on the left.  Eye Pupil- Bilateral- Regular and Round. Motion- Bilateral- EOMI.  Chest and Lung Exam Auscultation: Breath sounds:- clear at anterior chest wall and - clear at posterior chest wall. Adventitious sounds:- No Adventitious sounds.  Cardiovascular Auscultation:Rhythm- Regular rate and rhythm. Heart Sounds- S1 WNL and S2 WNL. Murmurs & Other Heart Sounds:Auscultation of the heart reveals - No Murmurs.  Abdomen Palpation/Percussion:Tenderness- Abdomen is non-tender to palpation. Rigidity (guarding)- Abdomen is soft. Auscultation:Auscultation of the abdomen reveals - Bowel sounds normal.  Male Genitourinary Not done, not pertinent to present illness  Musculoskeletal On exam walks with an antalgic gait. Varus stress, -5-110. Exquisitely tender over the medial joint line. Trace effusion. Knee exam on  inspection reveals no evidence of soft tissue swelling, ecchymosis, deformity or erythema. On palpation there is no tenderness in the lateral joint line. No patellofemoral pain with compression. Nontender over the fibular head or the peroneal nerve. Nontender over the quadriceps insertion of the patellar ligament insertion. Provocative maneuvers revealed a negative Lachman, negative anterior and posterior drawer, and negative McMurray. No instability was noted with valgus stressing at 0 or 30 degrees. On manual motor test the quadriceps and hamstrings were 5/5. Sensory exam was intact to light touch.  Imaging AP standing a lateral demonstrate end stage bone-on-bone arthrosis.  Assessment & Plan Post-traumatic osteoarthritis of right knee   Pt with end-stage R knee DJD refractory to conservative tx. Scheduled for TKA by Dr. Beane on 04/27/14. He has been cleared by his PCP. We again discussed surgery itself as well as risks, complications and alternatives, including but not limited to DVT, PE, infx, bleeding, failure of procedure, need for secondary procedure including manipulation, nerve injury, ongoing pain/symptoms, anesthesia risk, even stroke or death. Also discussed typical post-op protocols, activity restrictions, need for PT, flexion/extension exercises, time out of work. Discussed need for DVT ppx post-op with Xarelto then ASA per protocol. Discussed dental ppx. Also discussed limitations post-operatively such as kneeling and squatting. All questions were answered. Patient desires to proceed with surgery.  He will remain NPO after MN the night before surgery. He will hold ASA, NSAIDs, supplements accordingly. Discussed continued Percocet in the interim, add benadryl prn for itching. May need to send him home on PO dilaudid if he continues to have itching with Percocet, or could consider Norco 10mg. Discussed Xarelto as DVT ppx. Plan home with HHPT post-op. He has his [facility] appt today. He will follow up  10-14 days post-op for staple removal and xrays. He will remain out of work.   Plan R total knee replacement  Signed electronically by Jaclyn M Bissell, PA-C for Dr. Beane    

## 2014-04-27 NOTE — Progress Notes (Signed)
Dr. Council Mechanicenenny talked with patient's wife.

## 2014-04-27 NOTE — Progress Notes (Signed)
Dr. Council Mechanicenenny in to see patient- made aware of vital signs- condition- O.K. To go to floor

## 2014-04-27 NOTE — Progress Notes (Signed)
Dr. Shelle IronBeane and Dr. Council Mechanicenenny in  To see patient- aware of condition and vital signs

## 2014-04-27 NOTE — Progress Notes (Signed)
Dr Shelle IronBeane called by RN: requesting prn order for blood pressure control when pt on 6th floor.  Dr Shelle IronBeane reports he will place order.

## 2014-04-27 NOTE — Progress Notes (Signed)
Blood pressure now 163/80

## 2014-04-27 NOTE — Interval H&P Note (Signed)
History and Physical Interval Note:  04/27/2014 7:10 AM  Jose Webb  has presented today for surgery, with the diagnosis of right knee djd  The various methods of treatment have been discussed with the patient and family. After consideration of risks, benefits and other options for treatment, the patient has consented to  Procedure(s): RIGHT TOTAL KNEE ARTHROPLASTY (Right) as a surgical intervention .  The patient's history has been reviewed, patient examined, no change in status, stable for surgery.  I have reviewed the patient's chart and labs.  Questions were answered to the patient's satisfaction.     Derral Colucci C

## 2014-04-27 NOTE — Progress Notes (Signed)
Labetalol 2.5mg  IVP given for elevated blood pressures.

## 2014-04-27 NOTE — Transfer of Care (Signed)
Immediate Anesthesia Transfer of Care Note  Patient: Jose Webb  Procedure(s) Performed: Procedure(s): RIGHT TOTAL KNEE ARTHROPLASTY (Right)  Patient Location: PACU  Anesthesia Type:General  Level of Consciousness: awake, alert , oriented and patient cooperative  Airway & Oxygen Therapy: Patient Spontanous Breathing and Patient connected to face mask oxygen  Post-op Assessment: Report given to PACU RN, Post -op Vital signs reviewed and stable and Patient moving all extremities  Post vital signs: Reviewed and stable  Complications: No apparent anesthesia complications

## 2014-04-27 NOTE — Progress Notes (Signed)
X-ray results noted 

## 2014-04-27 NOTE — Progress Notes (Signed)
Dr. Denenny made aware of patient's blood pressures- orders given. 

## 2014-04-27 NOTE — Progress Notes (Signed)
Apresoline 5 mg IVP given for elevated blood pressures-  

## 2014-04-27 NOTE — Progress Notes (Signed)
Portable AP and Lateral Right Knee X-rays done. 

## 2014-04-27 NOTE — Progress Notes (Signed)
PT Cancellation Note  Patient Details Name: Jose FowlerSpurgeon T Ferrucci MRN: [id number] DOB: [birth year]   Cancelled Treatment:     PT deferred 2* pt's elevated pain level.  Will follow in am.   Mykala Mccready 04/27/2014, 4:06 PM

## 2014-04-27 NOTE — Brief Op Note (Signed)
04/27/2014  9:32 AM  PATIENT:  Jose FowlerSpurgeon T Badeaux  58 y.o. male  PRE-OPERATIVE DIAGNOSIS:  right knee degenerative joint disease  POST-OPERATIVE DIAGNOSIS:  right knee degenerative joint disease  PROCEDURE:  Procedure(s): RIGHT TOTAL KNEE ARTHROPLASTY (Right)  SURGEON:  Surgeon(s) and Role:    * Javier DockerJeffrey C Janay Canan, MD - Primary  PHYSICIAN ASSISTANT:   ASSISTANTS: Bissell   ANESTHESIA:   general  EBL:  Total I/O In: 1000 [I.V.:1000] Out: 250 [Urine:225; Blood:25]  BLOOD ADMINISTERED:none  DRAINS: Hemovac  LOCAL MEDICATIONS USED:  MARCAINE     SPECIMEN:  No Specimen  DISPOSITION OF SPECIMEN:  N/A  COUNTS:  YES  TOURNIQUET:   Total Tourniquet Time Documented: Thigh (Right) - 89 minutes Total: Thigh (Right) - 89 minutes   DICTATION: .Other Dictation: Dictation Number [id number]  PLAN OF CARE: Admit to inpatient   PATIENT DISPOSITION:  PACU - hemodynamically stable.   Delay start of Pharmacological VTE agent (>24hrs) due to surgical blood loss or risk of bleeding: no

## 2014-04-28 ENCOUNTER — Encounter (HOSPITAL_COMMUNITY): Payer: Self-pay | Admitting: Specialist

## 2014-04-28 LAB — BASIC METABOLIC PANEL
Anion gap: 12 (ref 5–15)
BUN: 14 mg/dL (ref 6–23)
CHLORIDE: 96 meq/L (ref 96–112)
CO2: 24 meq/L (ref 19–32)
CREATININE: 0.88 mg/dL (ref 0.50–1.35)
Calcium: 8.8 mg/dL (ref 8.4–10.5)
GFR calc non Af Amer: >90 mL/min (ref >90)
GLUCOSE: 148 mg/dL — AB (ref 70–99)
POTASSIUM: 4.2 meq/L (ref 3.7–5.3)
Sodium: 132 mEq/L — ABNORMAL LOW (ref 137–147)

## 2014-04-28 LAB — CBC
HCT: 37 % — ABNORMAL LOW (ref 39.0–52.0)
HEMOGLOBIN: 12.4 g/dL — AB (ref 13.0–17.0)
MCH: 30.7 pg (ref 26.0–34.0)
MCHC: 33.5 g/dL (ref 30.0–36.0)
MCV: 91.6 fL (ref 78.0–100.0)
Platelets: 157 10*3/uL (ref 150–400)
RBC: 4.04 MIL/uL — ABNORMAL LOW (ref 4.22–5.81)
RDW: 12.1 % (ref 11.5–15.5)
WBC: 9.9 10*3/uL (ref 4.0–10.5)

## 2014-04-28 MED ORDER — HYDROMORPHONE HCL 2 MG PO TABS
2.0000 mg | ORAL_TABLET | ORAL | Status: DC | PRN
Start: 2014-04-28 — End: 2016-04-16

## 2014-04-28 NOTE — Progress Notes (Signed)
CSW is available to assist with d/c planning. PN reviewed. PT recommends Home vs SNF depending on progress. CSW will continue to monitor progress and assist with ST Rehab placement if needed.  Cori RazorJamie Nathen Balaban LCSW [phone]

## 2014-04-28 NOTE — Progress Notes (Signed)
Physical Therapy Treatment Patient Details Name: Jose FowlerSpurgeon T Webb MRN: [id number] DOB: September 16, 1956 Today's Date: 04/28/2014    History of Present Illness s/p R TKR    PT Comments    Pt continues ltd by pain and with +2 assist required for safety with all mobility tasks.  Follow Up Recommendations  Home health PT;SNF     Equipment Recommendations  Rolling walker with 5" wheels    Recommendations for Other Services OT consult     Precautions / Restrictions Precautions Precautions: Knee;Fall Precaution Comments: back pain--s/p 2 sxs Required Braces or Orthoses: Knee Immobilizer - Right Knee Immobilizer - Right: Discontinue once straight leg raise with < 10 degree lag Restrictions Weight Bearing Restrictions: No Other Position/Activity Restrictions: WBAT    Mobility  Bed Mobility Overal bed mobility: Needs Assistance;+2 for physical assistance;+ 2 for safety/equipment Bed Mobility: Sit to Supine       Sit to supine: Mod assist;+2 for physical assistance   General bed mobility comments: assist for RLE and trunk  Transfers Overall transfer level: Needs assistance Equipment used: Rolling walker (2 wheeled) Transfers: Sit to/from Stand Sit to Stand: +2 physical assistance;Mod assist         General transfer comment: cues for UE/LE placement and sequence to perform safely  Ambulation/Gait Ambulation/Gait assistance: Mod assist;+2 physical assistance;+2 safety/equipment Ambulation Distance (Feet): 3 Feet Assistive device: Rolling walker (2 wheeled) Gait Pattern/deviations: Step-to pattern;Decreased step length - right;Decreased step length - left;Shuffle;Trunk flexed Gait velocity: decr   General Gait Details: cues for posture, sequence, position from RW, increased UE WB and basic saftey awareness   Stairs            Wheelchair Mobility    Modified Rankin (Stroke Patients Only)       Balance                                     Cognition Arousal/Alertness: Awake/alert Behavior During Therapy: WFL for tasks assessed/performed Overall Cognitive Status: Within Functional Limits for tasks assessed                      Exercises      General Comments        Pertinent Vitals/Pain 8/10; premed, cold packs provided    Home Living                      Prior Function            PT Goals (current goals can now be found in the care plan section) Acute Rehab PT Goals Patient Stated Goal: Walk with less pain PT Goal Formulation: With patient Time For Goal Achievement: 05/05/14 Potential to Achieve Goals: Good Progress towards PT goals: Progressing toward goals    Frequency  7X/week    PT Plan Current plan remains appropriate    Co-evaluation             End of Session Equipment Utilized During Treatment: Right knee immobilizer Activity Tolerance: Patient limited by pain Patient left: in bed;with call bell/phone within reach;with family/visitor present     Time: [id number] PT Time Calculation (min): 15 min  Charges:  $Gait Training: 8-22 mins                    G Codes:      Toshiko Kemler 04/28/2014, 3:57 PM

## 2014-04-28 NOTE — Discharge Instructions (Signed)
Elevate leg above heart 6x a day for 20minutes each °Use knee immobilizer while walking until can SLR x 10 °Use knee immobilizer in bed to keep knee in extension °Aquacel dressing may remain in place for 7 days. May shower with aquacel dressing in place. After 7 days, remove aquacel dressing. Do not remove steri-strips if they are present. Place new dressing with gauze and tape or ACE bandage which should be kept clean and dry and changed daily. °============================================== °Information on my medicine - XARELTO® (Rivaroxaban) ° °This medication education was reviewed with me or my healthcare representative as part of my discharge preparation.  The pharmacist that spoke with me during my hospital stay was:  Wellington Winegarden K, RPH ° °Why was Xarelto® prescribed for you? °Xarelto® was prescribed for you to reduce the risk of blood clots forming after orthopedic surgery. The medical term for these abnormal blood clots is venous thromboembolism (VTE). ° °What do you need to know about xarelto® ? °Take your Xarelto® ONCE DAILY at the same time every day. °You may take it either with or without food. ° °If you have difficulty swallowing the tablet whole, you may crush it and mix in applesauce just prior to taking your dose. ° °Take Xarelto® exactly as prescribed by your doctor and DO NOT stop taking Xarelto® without talking to the doctor who prescribed the medication.  Stopping without other VTE prevention medication to take the place of Xarelto® may increase your risk of developing a clot. ° °After discharge, you should have regular check-up appointments with your healthcare provider that is prescribing your Xarelto®.   ° °What do you do if you miss a dose? °If you miss a dose, take it as soon as you remember on the same day then continue your regularly scheduled once daily regimen the next day. Do not take two doses of Xarelto® on the same day.  ° °Important Safety Information °A possible side effect  of Xarelto® is bleeding. You should call your healthcare provider right away if you experience any of the following: °  Bleeding from an injury or your nose that does not stop. °  Unusual colored urine (red or dark brown) or unusual colored stools (red or black). °  Unusual bruising for unknown reasons. °  A serious fall or if you hit your head (even if there is no bleeding). ° °Some medicines may interact with Xarelto® and might increase your risk of bleeding while on Xarelto®. To help avoid this, consult your healthcare provider or pharmacist prior to using any new prescription or non-prescription medications, including herbals, vitamins, non-steroidal anti-inflammatory drugs (NSAIDs) and supplements. ° °This website has more information on Xarelto®: www.xarelto.com. ° ° °

## 2014-04-28 NOTE — Evaluation (Signed)
Occupational Therapy Evaluation Patient Details Name: Jose Webb MRN: [id number] DOB: 10-23-1955 Today's Date: 04/28/2014    History of Present Illness s/p R TKA   Clinical Impression   This 58 year old man was admited for the above surgery.  During eval, he was limited by pain.  OT will follow him in acute with min A level goals for toilet transfer/toileting. Wife will assist with LB adls.  Pt was independent prior to admission    Follow Up Recommendations  No OT follow up (likely)    Equipment Recommendations  3 in 1 bedside comode (likely:  has comfort height commode)    Recommendations for Other Services       Precautions / Restrictions Precautions Precautions: Knee;Fall Precaution Comments: back pain--s/p 2 sxs Required Braces or Orthoses: Knee Immobilizer - Right Knee Immobilizer - Right: Discontinue once straight leg raise with < 10 degree lag Restrictions Weight Bearing Restrictions: No Other Position/Activity Restrictions: WBAT      Mobility Bed Mobility   Bed Mobility: Supine to Sit     Supine to sit: +2 for physical assistance;Mod assist;HOB elevated     General bed mobility comments: assist for RLE and trunk  Transfers Overall transfer level: Needs assistance Equipment used: Rolling walker (2 wheeled) Transfers: Sit to/from [employer] Sit to Stand: +2 physical assistance;Mod assist Stand pivot transfers: +2 physical assistance;Mod assist       General transfer comment: cues for UE/LE placement and sequence to perform safely    Balance                                            ADL Overall ADL's : Needs assistance/impaired     Grooming: Sitting;Minimal assistance   Upper Body Bathing: Moderate assistance   Lower Body Bathing: +2 for physical assistance;Maximal assistance   Upper Body Dressing : Moderate assistance;Sitting   Lower Body Dressing: +2 for physical assistance;Total assistance    Toilet Transfer: +2 for physical assistance;Moderate assistance;Stand-pivot   Toileting- Clothing Manipulation and Hygiene: +2 for physical assistance;Maximal assistance         General ADL Comments: Pts ADLs limited by pain:  wife will assist with LB ADLs as needed.      Vision                     Perception     Praxis      Pertinent Vitals/Pain Did not rate pain but stated "it hurts a lot".  Premedicated; repositioned and left with PT     Hand Dominance Right   Extremity/Trunk Assessment Upper Extremity Assessment Upper Extremity Assessment: LUE deficits/detail LUE Deficits / Details: able to lift to 90; has L shoulder pain (prior to sx).  RUE WFLs          Communication Communication Communication: No difficulties   Cognition Arousal/Alertness: Awake/alert Behavior During Therapy: WFL for tasks assessed/performed Overall Cognitive Status: Within Functional Limits for tasks assessed                     General Comments       Exercises       Shoulder Instructions      Home Living Family/patient expects to be discharged to:: Private residence Living Arrangements: Spouse/significant other Available Help at Discharge: Family Type of Home: House Home Access: Stairs to enter [employer] of  Steps: 2 Entrance Stairs-Rails: None Home Layout: One level     Bathroom Shower/Tub: Tub/shower unit;Door Shower/tub characteristics: Door Bathroom Toilet: Handicapped height (1701/2")     Home Equipment: None   Additional Comments: wife works in [employer].  She is familiar with tub bench and 3:1.  Doors would have to be removed for tub bench:  she will consider this      Prior Functioning/Environment Level of Independence: Independent             OT Diagnosis: Generalized weakness   OT Problem List: Decreased strength;Decreased activity tolerance;Decreased knowledge of use of DME or AE;Pain   OT Treatment/Interventions:  Self-care/ADL training;DME and/or AE instruction;Patient/family education    OT Goals(Current goals can be found in the care plan section) Acute Rehab OT Goals Patient Stated Goal: Walk with less pain OT Goal Formulation: With patient Time For Goal Achievement: 05/05/14 Potential to Achieve Goals: Good ADL Goals Pt Will Transfer to Toilet: with min assist;bedside commode;ambulating Pt Will Perform Toileting - Clothing Manipulation and hygiene: with min assist;sit to/from stand (for sit to stand)  OT Frequency: Min 2X/week   Barriers to D/C:            Co-evaluation PT/OT/SLP Co-Evaluation/Treatment: Yes Reason for Co-Treatment: For patient/therapist safety PT goals addressed during session: Mobility/safety with mobility OT goals addressed during session: ADL's and self-care      End of Session    Activity Tolerance: Patient limited by pain Patient left: in chair;with call bell/phone within reach;with family/visitor present   Time: [id number] and 1200-1208 OT Time Calculation (min): 34 min Charges:  OT General Charges $OT Visit: 1 Procedure OT Evaluation $Initial OT Evaluation Tier I: 1 Procedure OT Treatments $Self Care/Home Management : 8-22 mins G-Codes:    Adrine Hayworth 04/28/2014, 1:07 PM   Marica OtterMaryellen Alfie Alderfer, OTR/L [phone] 04/28/2014

## 2014-04-28 NOTE — Care Management Note (Signed)
    Page 1 of 1   04/28/2014     10:01:22 AM CARE MANAGEMENT NOTE 04/28/2014  Patient:  Cheri FowlerYERS,Jasher T   Account Number:  [id number]  Date Initiated:  04/28/2014  Documentation initiated by:  Lorenda IshiharaPEELE,Lareen Mullings  Subjective/Objective Assessment:   58 yo male admitted s/p Right total knee arthroplasty. PTA lived at home with spouse.     Action/Plan:   Home when stable   Anticipated DC Date:  05/01/2014   Anticipated DC Plan:  HOME W HOME HEALTH SERVICES      DC Planning Services  CM consult      [facility]   Choice offered to / List presented to:  C-1 Patient        HH arranged  HH-2 PT      [facility]   Status of service:  Completed, signed off Medicare Important Message given?  NA - LOS <3 / Initial given by admissions (If response is "NO", the following Medicare IM given date fields will be blank) Date Medicare IM given:   Medicare IM given by:   Date Additional Medicare IM given:   Additional Medicare IM given by:    Discharge Disposition:  HOME W HOME HEALTH SERVICES  Per UR Regulation:  Reviewed for med. necessity/level of care/duration of stay  If discussed at Long Length of Stay Meetings, dates discussed:    Comments:

## 2014-04-28 NOTE — Progress Notes (Signed)
Physical Therapy Treatment Patient Details Name: Jose Webb MRN: [id number] DOB: 03-07-1956 Today's Date: 04/28/2014    History of Present Illness s/p R TKR    PT Comments    Pt continues very limited by pain with +2 assist required for all mobility tasks and saftey  Follow Up Recommendations  Home health PT;SNF     Equipment Recommendations  Rolling walker with 5" wheels    Recommendations for Other Services OT consult     Precautions / Restrictions Precautions Precautions: Knee;Fall Precaution Comments: back pain--s/p 2 sxs Required Braces or Orthoses: Knee Immobilizer - Right Knee Immobilizer - Right: Discontinue once straight leg raise with < 10 degree lag Restrictions Weight Bearing Restrictions: No Other Position/Activity Restrictions: WBAT    Mobility  Bed Mobility Overal bed mobility: Needs Assistance;+2 for physical assistance;+ 2 for safety/equipment Bed Mobility: Supine to Sit     Supine to sit: +2 for physical assistance;Mod assist;HOB elevated     General bed mobility comments: assist for RLE and trunk  Transfers Overall transfer level: Needs assistance Equipment used: Rolling walker (2 wheeled) Transfers: Sit to/from Stand Sit to Stand: +2 physical assistance;Mod assist Stand pivot transfers: +2 physical assistance;Mod assist       General transfer comment: cues for UE/LE placement and sequence to perform safely  Ambulation/Gait Ambulation/Gait assistance: Mod assist;+2 physical assistance Ambulation Distance (Feet): 3 Feet Assistive device: Rolling walker (2 wheeled) Gait Pattern/deviations: Step-to pattern;Shuffle;Decreased step length - right;Decreased step length - left;Decreased stance time - right;Antalgic;Trunk flexed     General Gait Details: cues for posture, sequence, position from RW, increased UE WB and basic saftey awareness   Stairs            Wheelchair Mobility    Modified Rankin (Stroke Patients Only)        Balance                                    Cognition Arousal/Alertness: Awake/alert Behavior During Therapy: WFL for tasks assessed/performed Overall Cognitive Status: Within Functional Limits for tasks assessed                      Exercises Total Joint Exercises Ankle Circles/Pumps: Both;10 reps;AAROM;Supine Quad Sets: AAROM;Both;10 reps;Supine Heel Slides: AAROM;Right;10 reps;Supine Straight Leg Raises: AAROM;Right;10 reps;Supine    General Comments        Pertinent Vitals/Pain 8/10; premed; ice packs provided    Home Living Family/patient expects to be discharged to:: Private residence Living Arrangements: Spouse/significant other Available Help at Discharge: Family Type of Home: House Home Access: Stairs to enter Entrance Stairs-Rails: None Home Layout: One level Home Equipment: None Additional Comments: wife works in [employer].  She is familiar with tub bench and 3:1.  Doors would have to be removed for tub bench:  she will consider this    Prior Function Level of Independence: Independent          PT Goals (current goals can now be found in the care plan section) Acute Rehab PT Goals Patient Stated Goal: Walk with less pain PT Goal Formulation: With patient Time For Goal Achievement: 05/05/14 Potential to Achieve Goals: Good Progress towards PT goals: Progressing toward goals    Frequency  7X/week    PT Plan Current plan remains appropriate    Co-evaluation PT/OT/SLP Co-Evaluation/Treatment: Yes Reason for Co-Treatment: For patient/therapist safety PT goals addressed during session: Mobility/safety with mobility  OT goals addressed during session: ADL's and self-care     End of Session Equipment Utilized During Treatment: Right knee immobilizer Activity Tolerance: Patient limited by pain Patient left: in chair;with call bell/phone within reach;with family/visitor present     Time: 8657-84691133-1217 PT Time Calculation  (min): 44 min  Charges:  $Gait Training: 8-22 mins $Therapeutic Exercise: 8-22 mins                    G Codes:      Jose Webb 04/28/2014, 2:10 PM

## 2014-04-28 NOTE — Op Note (Signed)
NAMEKATHERINE, SYME NO.:  [id number]  MEDICAL RECORD NO.:  [id number]  LOCATION:  1604                         FACILITY:  [facility]  PHYSICIAN:  Jene Every, M.D.    DATE OF BIRTH:  1956-03-01  DATE OF PROCEDURE:  04/27/2014 DATE OF DISCHARGE:                              OPERATIVE REPORT   PREOPERATIVE DIAGNOSIS:  Degenerative joint disease with varus deformity, end stage, right knee.  POSTOPERATIVE DIAGNOSIS:  Degenerative joint disease with varus deformity, end stage, right knee.  PROCEDURE PERFORMED:  Right total knee arthroplasty.  ANESTHESIA:  General.  ASSISTANT:  Lanna Poche, PA.  COMPONENTS:  DePuy rotating platform, 5 femur, 5 tibia, 10-mm insert, 41 patella.  HISTORY:  A 58 year old, end-stage bone-on-bone arthritis, varus deformity, refractory to conservative treatment, indicated for replacement of the degenerated joint.  He had failed conservative treatment, had bone-on-bone arthritis, requiring narcotic analgesics, difficulty performing activities of daily living, indicated for replacement.  Risks and benefits were discussed including bleeding, infection, damage to neurovascular structures, DVT, PE, anesthetic complications, loosening, need for revision, arthrofibrosis, etc.  TECHNIQUE:  With the patient in supine position, after the induction of adequate general anesthesia and 2 g Kefzol, the right lower extremity was prepped, draped, and exsanguinated in usual sterile fashion.  Thigh tourniquet inflated to 300 mmHg.  Midline incision was made with the knee flexed.  Full-thickness flaps developed.  Median parapatellar arthrotomy performed.  Hypertrophic synovitis excised.  Patella everted, knee flexed.  Compartment arthritis was noted, particularly the posterior medial compartment and the patellofemoral joint.  There were remnants of the medial and lateral menisci and the ACL were removed.  We elevated the soft tissues medially,  preserving the MCL.  Step drill utilized to enter the femoral canal and was irrigated, 11 off the distal femur was used due to a slight flexion contracture.  This was then cut with an oscillating saw.  We sized the femur to 5 off the anterior cortex, pinned it in 3 degrees of external rotation and performed anterior, posterior, and chamfer cuts accordingly.  Attention turned towards the tibia, subluxed, protected with soft retractors at all times.  External alignment guide for off the defect medially, bisecting the ankle joint parallel to the tibia.  Appropriate slope pinned, oscillating saw performed by cut.  Our flexion-extension gaps were equivalent.  We then measured the tibia maximally at 5, just to the medial aspect of the tibial tubercle pin, drilled.  We then completed the femur, used our box cut, centering over the intramedullary canal, performed our cut, placed a trial femur and tibia, and a 10-mm insert with full extension and full flexion, good stability with varus valgus stressing, 0 to 30 degrees.  We completed the patella, measured at 29, we planed 10 off that to 19 utilizing external jig.  Good parallel surface realized.  We drilled our PEG holes after measuring it to 41 medializing the PEG holes.  We then trialed our patellar with excellent patellofemoral tracking.  All instrumentation was removed.  We checked posteriorly, cauterized the geniculates, removed any remnants of the menisci or scar tissue.  We debrided the fat pad.  We used pulsatile lavage, cleaned the  joint, flexed it, subluxed tibia, dried all surfaces, mixed the cement in the back table, injected into the canal, we pressurized the cement, impacted the 5 tibial tray.  Redundant cement removed.  We cemented the femur, redundant cement removed, and impacted it, placed a trial insert 10, reduced it and held it in extension with an axial load throughout the curing of the cement.  We then cemented the patella as  well.  After full curing of the cement, we flexed the knee, removed all redundant cement with osteotome.  We checked our range of motion, full extension, full flexion, good stability with varus valgus stressing, 0 to 30 degrees, negative anterior drawer.  Excellent patellofemoral tracking.  I then removed the trial insert and irrigated the wound, checked posteriorly, no redundant cement noted.  Placed our permanent insert, reduced it, and again full range and excellent stability.  Wound was copiously irrigated, placed Hemovac, brought it out through a lateral stab wound in the skin, reapproximated the patellar tendon anatomically with #1 Vicryl and then over sewed it with a V-Loc suture.  Subcu with 2-0 and skin with staples.  We had placed Exparel in the periarticular musculature and the periosteum.  We had flexion to gravity at 90 degrees and excellent stability, full flexion, full extension.  Negative anterior drawer.  After sterile dressing was applied, tourniquet was deflated and there was adequate revascularization of lower extremities appreciated.  The patient tolerated the procedure well.  No complications.  Tourniquet time 89 minutes.  No blood loss.     Jene EveryJeffrey Chantel Teti, M.D.     Cordelia PenJB/MEDQ  D:  04/27/2014  T:  04/27/2014  Job:  [id number]

## 2014-04-28 NOTE — Progress Notes (Signed)
Subjective: 1 Day Post-Op Procedure(s) (LRB): RIGHT TOTAL KNEE ARTHROPLASTY (Right) Patient reports pain as severe.  Noting severe pain in knee. Having some back pain as well. Denies numbness or tingling. "No one understands how bad the pain is" His daughter is here with him. He is not tearful like he was in PACU yesterday and by my observation does seem slightly more comfortable this AM. Seen in AM rounds for Dr. Shelle IronBeane.  Objective: Vital signs in last 24 hours: Temp:  [97.5 F (36.4 C)-98.6 F (37 C)] 97.9 F (36.6 C) (07/10 0655) Pulse Rate:  [79-109] 109 (07/10 0655) Resp:  [9-25] 19 (07/10 0655) BP: (148-201)/(69-135) 166/91 mmHg (07/10 0655) SpO2:  [97 %-100 %] 97 % (07/10 0655) Weight:  [105.235 kg (232 lb)] 105.235 kg (232 lb) (07/09 1343)  Intake/Output from previous day: 07/09 0701 - 07/10 0700 In: 4032.5 [P.O.:720; I.V.:3107.5; IV Piggyback:205] Out: 2790 [Urine:2525; Drains:240; Blood:25] Intake/Output this shift:     Recent Labs  04/28/14 0447  HGB 12.4*    Recent Labs  04/28/14 0447  WBC 9.9  RBC 4.04*  HCT 37.0*  PLT 157    Recent Labs  04/28/14 0447  NA 132*  K 4.2  CL 96  CO2 24  BUN 14  CREATININE 0.88  GLUCOSE 148*  CALCIUM 8.8   No results found for this basename: LABPT, INR,  in the last 72 hours  Neurologically intact ABD soft Neurovascular intact Sensation intact distally Intact pulses distally Dorsiflexion/Plantar flexion intact Incision: dressing C/D/I and no drainage No cellulitis present Compartment soft No calf pain or sign of DVT  Assessment/Plan: 1 Day Post-Op Procedure(s) (LRB): RIGHT TOTAL KNEE ARTHROPLASTY (Right) Advance diet Up with therapy D/C IV fluids Drain with 110 cc output already this AM- will pull later today Up with PT today Plan D/C home with HHPT when ready, likely Sunday or Monday depending on pain control Will discuss with Dr. Elissa LovettBeane  Jose Webb, Jose BarkerJACLYN M. 04/28/2014, 7:20 AM

## 2014-04-28 NOTE — Evaluation (Signed)
Physical Therapy Evaluation Patient Details Name: Jose Webb MRN: [id number] DOB: 1956/09/28 Today's Date: 04/28/2014   History of Present Illness  s/p R KT  Clinical Impression  Pt s/p R TKR presents with decreased R LE strength/ROM, and severe post op pain limiting functional mobility.  Pt hopes to progress to d/c home with follow up HHPT and family assist    Follow Up Recommendations Home health PT;SNF (dependent on acute stay progress)    Equipment Recommendations  Rolling walker with 5" wheels    Recommendations for Other Services OT consult     Precautions / Restrictions Precautions Precautions: Knee;Fall Precaution Comments: back pain--s/p 2 sxs Required Braces or Orthoses: Knee Immobilizer - Right Knee Immobilizer - Right: Discontinue once straight leg raise with < 10 degree lag Restrictions Weight Bearing Restrictions: No Other Position/Activity Restrictions: WBAT      Mobility  Bed Mobility                  Transfers                    Ambulation/Gait                Stairs            Wheelchair Mobility    Modified Rankin (Stroke Patients Only)       Balance                                             Pertinent Vitals/Pain 9/10;  Premed, IV pain meds provided.    Home Living Family/patient expects to be discharged to:: Private residence Living Arrangements: Spouse/significant other Available Help at Discharge: Family Type of Home: House Home Access: Stairs to enter Entrance Stairs-Rails: None Entrance Stairs-Number of Steps: 2 Home Layout: One level Home Equipment: None      Prior Function Level of Independence: Independent               Hand Dominance   Dominant Hand: Right    Extremity/Trunk Assessment   Upper Extremity Assessment: LUE deficits/detail       LUE Deficits / Details: able to lift to 90; has L shoulder pain (prior to sx).  RUE WFLs   Lower Extremity  Assessment: RLE deficits/detail RLE Deficits / Details: 2-/5 quads with AAROM at knee -10 - 20       Communication   Communication: No difficulties  Cognition Arousal/Alertness: Awake/alert Behavior During Therapy: WFL for tasks assessed/performed Overall Cognitive Status: Within Functional Limits for tasks assessed                      General Comments      Exercises        Assessment/Plan    PT Assessment Patient needs continued PT services  PT Diagnosis Difficulty walking   PT Problem List Decreased strength;Decreased range of motion;Decreased activity tolerance;Decreased mobility;Decreased knowledge of use of DME;Pain  PT Treatment Interventions DME instruction;Gait training;Stair training;Functional mobility training;Therapeutic activities;Therapeutic exercise;Patient/family education   PT Goals (Current goals can be found in the Care Plan section) Acute Rehab PT Goals Patient Stated Goal: Walk with less pain PT Goal Formulation: With patient Time For Goal Achievement: 05/05/14 Potential to Achieve Goals: Good    Frequency 7X/week   Barriers to discharge        Co-evaluation   Reason  for Co-Treatment: For patient/therapist safety PT goals addressed during session: Mobility/safety with mobility OT goals addressed during session: ADL's and self-care       End of Session Equipment Utilized During Treatment: Right knee immobilizer Activity Tolerance: Patient limited by pain Patient left: in bed;with call bell/phone within reach;with family/visitor present Nurse Communication: Patient requests pain meds (RN providing IV pain meds prior to OOB)         Time: [id number] PT Time Calculation (min): 10 min   Charges:   PT Evaluation $Initial PT Evaluation Tier I: 1 Procedure     PT G Codes:          Shequita Peplinski 04/28/2014, 1:00 PM

## 2014-04-29 LAB — CBC
HEMATOCRIT: 34.8 % — AB (ref 39.0–52.0)
Hemoglobin: 11.9 g/dL — ABNORMAL LOW (ref 13.0–17.0)
MCH: 30.8 pg (ref 26.0–34.0)
MCHC: 34.2 g/dL (ref 30.0–36.0)
MCV: 90.2 fL (ref 78.0–100.0)
Platelets: 156 10*3/uL (ref 150–400)
RBC: 3.86 MIL/uL — AB (ref 4.22–5.81)
RDW: 12.3 % (ref 11.5–15.5)
WBC: 11.2 10*3/uL — ABNORMAL HIGH (ref 4.0–10.5)

## 2014-04-29 NOTE — Plan of Care (Deleted)
Problem: Phase III Progression Outcomes Goal: Anticoagulant follow-up in place Outcome: Not Applicable Date Met:  67/25/50 asa

## 2014-04-29 NOTE — Plan of Care (Signed)
Problem: Phase III Progression Outcomes Goal: Anticoagulant follow-up in place Outcome: Not Applicable Date Met:  73/95/84 xarelto

## 2014-04-29 NOTE — Progress Notes (Signed)
Met with Pt and Pt's mother to discuss d/c plans.  Pt stated that he will d/c home and that he is confident that his wife and daughter will be able to meet his needs.  Pt stated that he intends to spend a lot of time in his recliner, as it reclines almost all the way down and that it will do a nice job of elevating his legs.  Pt not interested in considering SNF.  CSW thanked Pt and his mother for their time.  Bernita Raisin, [place] [facility] [phone]

## 2014-04-29 NOTE — Progress Notes (Signed)
Physical Therapy Treatment Patient Details Name: Jose Webb MRN: [id number] DOB: 1956/10/16 Today's Date: 04/29/2014    History of Present Illness s/p R TKR    PT Comments    Slow progress with pt struggling 2* pain and poor condition of non-operative knee as well as back pain and arthritic hand pain.  Follow Up Recommendations  Home health PT;SNF     Equipment Recommendations  Rolling walker with 5" wheels    Recommendations for Other Services OT consult     Precautions / Restrictions Precautions Precautions: Knee;Fall Precaution Comments: back pain--s/p 2 sxs Required Braces or Orthoses: Knee Immobilizer - Right Knee Immobilizer - Right: Discontinue once straight leg raise with < 10 degree lag Restrictions Weight Bearing Restrictions: No Other Position/Activity Restrictions: WBAT    Mobility  Bed Mobility Overal bed mobility: Needs Assistance;+2 for physical assistance;+ 2 for safety/equipment Bed Mobility: Supine to Sit     Supine to sit: +2 for physical assistance;Min assist;Mod assist;HOB elevated     General bed mobility comments: Increased time, cues for sequence and use of L LE to self assist; physical assist with R LE and for trunk  Transfers Overall transfer level: Needs assistance Equipment used: Rolling walker (2 wheeled) Transfers: Sit to/from Stand Sit to Stand: +2 physical assistance;Mod assist         General transfer comment: cues for UE/LE placement and sequence to perform safely  Ambulation/Gait Ambulation/Gait assistance: Mod assist;+2 physical assistance Ambulation Distance (Feet): 26 Feet Assistive device: Rolling walker (2 wheeled) Gait Pattern/deviations: Step-to pattern;Decreased step length - right;Decreased step length - left;Shuffle;Trunk flexed Gait velocity: decr   General Gait Details: cues for posture, sequence, position from RW, increased UE WB and basic saftey awareness   Stairs            Wheelchair  Mobility    Modified Rankin (Stroke Patients Only)       Balance                                    Cognition Arousal/Alertness: Awake/alert Behavior During Therapy: WFL for tasks assessed/performed Overall Cognitive Status: Within Functional Limits for tasks assessed                      Exercises Total Joint Exercises Ankle Circles/Pumps: Both;10 reps;AAROM;Supine Quad Sets: AAROM;Both;10 reps;Supine Heel Slides: AAROM;Right;10 reps;Supine Straight Leg Raises: AAROM;Right;10 reps;Supine Goniometric ROM: AAROM at R knee -10 - 30    General Comments        Pertinent Vitals/Pain 7/10; premed, ice packs provided    Home Living                      Prior Function            PT Goals (current goals can now be found in the care plan section) Acute Rehab PT Goals Patient Stated Goal: Walk with less pain PT Goal Formulation: With patient Time For Goal Achievement: 05/05/14 Potential to Achieve Goals: Good Progress towards PT goals: Progressing toward goals    Frequency  7X/week    PT Plan Current plan remains appropriate    Co-evaluation             End of Session   Activity Tolerance: Patient limited by pain Patient left: in chair;with call bell/phone within reach     Time: 1217-1250 PT Time Calculation (min): 33 min  Charges:  $Gait Training: 8-22 mins $Therapeutic Exercise: 8-22 mins                    G Codes:      Chrissie Dacquisto 05-19-14, 4:35 PM

## 2014-04-29 NOTE — Progress Notes (Signed)
   Subjective: 2 Days Post-Op Procedure(s) (LRB): RIGHT TOTAL KNEE ARTHROPLASTY (Right)  Pt still c/o moderate pain to right knee Therapy has been slow due to pain Otherwise pt doing okay Patient reports pain as moderate.  Objective:   VITALS:   Filed Vitals:   04/29/14 0444  BP: 149/79  Pulse: 109  Temp: 98.8 F (37.1 C)  Resp: 20    Right knee incision healing well Drain site dressing changed nv intact distally Mild edema to right knee and lower leg No calf tenderness  LABS  Recent Labs  04/28/14 0447 04/29/14 0428  HGB 12.4* 11.9*  HCT 37.0* 34.8*  WBC 9.9 11.2*  PLT 157 156     Recent Labs  04/28/14 0447  NA 132*  K 4.2  BUN 14  CREATININE 0.88  GLUCOSE 148*     Assessment/Plan: 2 Days Post-Op Procedure(s) (LRB): RIGHT TOTAL KNEE ARTHROPLASTY (Right) Continue PT/OT Pain control Possible d/c tomorrow vs Monday     Brad Bryley Chrisman, MPAS, PA-C  04/29/2014, 8:08 AM

## 2014-04-29 NOTE — Progress Notes (Signed)
Physical Therapy Treatment Patient Details Name: Jose Webb MRN: [id number] DOB: 01/15/1956 Today's Date: 04/29/2014    History of Present Illness s/p R TKR    PT Comments    Pt cooperative but progressing slowly 2* pain, poor condition of non-operative knee, back pain s/p multiple surgeries and bil arthritic hand pain.  Pt had hoped for d/c Sunday but based on current status would benefit greatly from additional day acute care rehab to maximize safety and IND prior to return home.  Follow Up Recommendations  Home health PT;SNF     Equipment Recommendations  Rolling walker with 5" wheels    Recommendations for Other Services OT consult     Precautions / Restrictions Precautions Precautions: Knee;Fall Precaution Comments: back pain--s/p 2 sxs; bilat hand pain 2* arthritis Required Braces or Orthoses: Knee Immobilizer - Right Knee Immobilizer - Right: Discontinue once straight leg raise with < 10 degree lag Restrictions Weight Bearing Restrictions: No Other Position/Activity Restrictions: WBAT    Mobility  Bed Mobility Overal bed mobility: Needs Assistance;+2 for physical assistance;+ 2 for safety/equipment Bed Mobility: Sit to Supine       Sit to supine: Min assist;Mod assist;+2 for physical assistance   General bed mobility comments: Increased time, cues for sequence and use of L LE to self assist; physical assist with R LE and for trunk  Transfers Overall transfer level: Needs assistance Equipment used: Rolling walker (2 wheeled) Transfers: Sit to/from Stand Sit to Stand: Min assist;Mod assist Stand pivot transfers: Min assist;Mod assist       General transfer comment: cues for UE/LE placement and sequence to perform safely  Ambulation/Gait Ambulation/Gait assistance: Min assist;Mod assist Ambulation Distance (Feet): 52 Feet Assistive device: Rolling walker (2 wheeled) Gait Pattern/deviations: Step-to pattern;Decreased step length - right;Decreased  step length - left;Shuffle;Trunk flexed Gait velocity: decr   General Gait Details: cues for posture, sequence, position from RW, increased UE WB and basic saftey awareness   Stairs            Wheelchair Mobility    Modified Rankin (Stroke Patients Only)       Balance                                    Cognition Arousal/Alertness: Awake/alert Behavior During Therapy: WFL for tasks assessed/performed Overall Cognitive Status: Within Functional Limits for tasks assessed                      Exercises      General Comments        Pertinent Vitals/Pain     Home Living                      Prior Function            PT Goals (current goals can now be found in the care plan section) Acute Rehab PT Goals Patient Stated Goal: Walk with less pain PT Goal Formulation: With patient Time For Goal Achievement: 05/05/14 Potential to Achieve Goals: Good Progress towards PT goals: Progressing toward goals    Frequency  7X/week    PT Plan Current plan remains appropriate    Co-evaluation             End of Session Equipment Utilized During Treatment: Right knee immobilizer Activity Tolerance: Patient tolerated treatment well;Patient limited by pain;Patient limited by fatigue Patient left: in bed;with call  bell/phone within reach;with family/visitor present     Time: 1610-9604 PT Time Calculation (min): 43 min  Charges:  $Gait Training: 23-37 mins $Therapeutic Activity: 8-22 mins                    G Codes:      Jose Webb 2014-05-21, 5:02 PM

## 2014-04-30 LAB — CBC
HEMATOCRIT: 33.8 % — AB (ref 39.0–52.0)
Hemoglobin: 11.2 g/dL — ABNORMAL LOW (ref 13.0–17.0)
MCH: 30.5 pg (ref 26.0–34.0)
MCHC: 33.1 g/dL (ref 30.0–36.0)
MCV: 92.1 fL (ref 78.0–100.0)
PLATELETS: 181 10*3/uL (ref 150–400)
RBC: 3.67 MIL/uL — ABNORMAL LOW (ref 4.22–5.81)
RDW: 12.3 % (ref 11.5–15.5)
WBC: 11 10*3/uL — AB (ref 4.0–10.5)

## 2014-04-30 NOTE — Progress Notes (Signed)
Occupational Therapy Treatment Patient Details Name: Jose Webb MRN: [id number] DOB: January 20, 1956 Today's Date: 04/30/2014    History of present illness s/p R TKR   OT comments  Pt progressing slowly. Feel pt would benefit from SNF and pt's wife in agreement, but pt refusing SNF at this time.  Follow Up Recommendations  Supervision/Assistance - 24 hour (pt declining SNF)    Equipment Recommendations  3 in 1 bedside comode    Recommendations for Other Services      Precautions / Restrictions Precautions Precautions: Knee;Fall Precaution Comments: back pain--s/p 2 sxs; bilat hand pain 2* arthritis Required Braces or Orthoses: Knee Immobilizer - Right Knee Immobilizer - Right: Discontinue once straight leg raise with < 10 degree lag Restrictions Weight Bearing Restrictions: No Other Position/Activity Restrictions: WBAT       Mobility Bed Mobility Overal bed mobility: Needs Assistance Bed Mobility: Sit to Supine     Supine to sit: Mod assist     General bed mobility comments: Increased time, cues for sequence and use of L LE to self assist; physical assist with R LE and for trunk  Transfers Overall transfer level: Needs assistance Equipment used: Rolling walker (2 wheeled) Transfers: Sit to/from Stand Sit to Stand: Min assist;Mod assist         General transfer comment: cues for UE/LE placement and sequence to perform safely    Balance                                   ADL Overall ADL's : Needs assistance/impaired                     Lower Body Dressing: Moderate assistance Lower Body Dressing Details (indicate cue type and reason): underwear; instruction provided to start with operating leg first Toilet Transfer: Stand-pivot;Minimal assistance;+2 for safety/equipment   Toileting- Clothing Manipulation and Hygiene: +2 for safety/equipment;Maximal assistance       Functional mobility during ADLs: Minimal assistance;Moderate  assistance;+2 for safety/equipment;Rolling walker General ADL Comments: Pts ADLs limited by pain:  wife will assist with LB ADLs as needed. Ambulated to BR with RW and KI, 3 in 1 over toilet. Bladder incontinence all over floor, writer had to clean up.       Vision                     Perception     Praxis      Cognition   Behavior During Therapy: WFL for tasks assessed/performed Overall Cognitive Status: Within Functional Limits for tasks assessed                       Extremity/Trunk Assessment               Exercises     Shoulder Instructions       General Comments      Pertinent Vitals/ Pain       C/o pain B hands, B knees, pt reports he recently had pain medication  Home Living                                          Prior Functioning/Environment              Frequency Min 2X/week     Progress Toward  Goals  OT Goals(current goals can now be found in the care plan section)  Progress towards OT goals: Progressing toward goals     Plan Discharge plan remains appropriate    Co-evaluation                 End of Session Equipment Utilized During Treatment: Gait belt;Rolling walker;Right knee immobilizer   Activity Tolerance Patient limited by pain   Patient Left in chair;with call bell/phone within reach;with family/visitor present   Nurse Communication          Time: [id number] OT Time Calculation (min): 44 min  Charges: OT General Charges $OT Visit: 1 Procedure OT Treatments $Self Care/Home Management : 38-52 mins  Norvella Loscalzo A 04/30/2014, 12:20 PM

## 2014-04-30 NOTE — Progress Notes (Signed)
PT Cancellation Note  Patient Details Name: Jose Webb MRN: [id number] DOB: 1955/11/29   Cancelled Treatment:   Pt refused second session to walk, so only tolerated walking to/from bathroom with OT this morning. Pt stated he will do tomorrow and looking forward to "going home tomorro". I had a frank conversation with pt that if he was not moving, walking and not able to do a step in order to go home , then it would not be safe for him to DC home. I still emphasized that if he can't do these things that I would recommend short term Rehab somewhere. Pt still convinced he will be able to mange at home, however, has not been able to tolerate much at all here.       Jose Webb, Jose Webb 04/30/2014, 4:36 PM Jose Webb, PT Pager: [phone] 04/30/2014

## 2014-04-30 NOTE — Progress Notes (Signed)
Physical Therapy Treatment Patient Details Name: Jose FowlerSpurgeon T Webb MRN: [id number] DOB: July 01, 1956 Today's Date: 04/30/2014    History of Present Illness s/p R TKR    PT Comments    Pt worked with OT this morning (and unable to tolerate PT in the morning as well per pt request) . Then in afternoon, pt transferred back to bed with nursing, and then stated he was unable to tolerate walking with me after that. We did spend a lot of time at bedside supine working on RLE exercise tolerating intervals of 3 for total of 10 each. Pt with al lot of swelling , redness and heat in the knee and educated how important it is to begin to move , ambulate and exercises to get increased flow to decrease the swelling. Pt seemed to understand somewhat. After exercises, pt still refused to walk at this time stating" I don't think I will be able to today". Had a conversation with him that if he is not able to move much with us, how well and safe will it be for him to return home? Pt stated " I will be able to do it at home". Worried about safety at home,  And feel he would most benefit from ST-SNF rehab, but feel pt will refuse.    Follow Up Recommendations  SNF (pt want home, but really feel he needs SNF to progress more )     Equipment Recommendations  Rolling walker with 5" wheels    Recommendations for Other Services OT consult     Precautions / Restrictions Precautions Precautions: Knee;Fall Precaution Comments: back pain--s/p 2 sxs; bilat hand pain 2* arthritis Required Braces or Orthoses: Knee Immobilizer - Right Knee Immobilizer - Right: Discontinue once straight leg raise with < 10 degree lag Restrictions Weight Bearing Restrictions: No Other Position/Activity Restrictions: WBAT    Mobility  Bed Mobility Overal bed mobility: Needs Assistance Bed Mobility: Sit to Supine     Supine to sit: Mod assist     General bed mobility comments: Increased time, cues for sequence and use of L LE to  self assist; physical assist with R LE and for trunk  Transfers Overall transfer level: Needs assistance Equipment used: Rolling walker (2 wheeled) Transfers: Sit to/from Stand Sit to Stand: Min assist;Mod assist         General transfer comment: cues for UE/LE placement and sequence to perform safely  Ambulation/Gait                 Stairs            Wheelchair Mobility    Modified Rankin (Stroke Patients Only)       Balance                                    Cognition Arousal/Alertness: Awake/alert Behavior During Therapy: WFL for tasks assessed/performed Overall Cognitive Status: Within Functional Limits for tasks assessed                      Exercises Total Joint Exercises Ankle Circles/Pumps: AROM;Right;10 reps;Supine Quad Sets: AAROM;Right;10 reps;Supine Short Arc Quad: AAROM;Right;10 reps;Supine Heel Slides: AAROM;Right;10 reps;Supine Hip ABduction/ADduction: AAROM;Right;10 reps;Supine Straight Leg Raises: AAROM;Right;10 reps;Supine Goniometric ROM: grossly 5-35 in supine  (worked in sets of 3 and 2 to get to 10 reps )    General Comments        Pertinent Vitals/Pain 6/10  during exercises. toerated more than yesterday. Positioning straighter in bed with towel roll on R side ofn RLE to prevent external rotation adn flexion during relaxing. Ice applied to knee after session.     Home Living                      Prior Function            PT Goals (current goals can now be found in the care plan section) Acute Rehab PT Goals Patient Stated Goal: Walk with less pain PT Goal Formulation: With patient Time For Goal Achievement: 05/05/14 Potential to Achieve Goals: Good Progress towards PT goals: Progressing toward goals (slowly)    Frequency  7X/week    PT Plan Current plan remains appropriate    Co-evaluation             End of Session Equipment Utilized During Treatment: Right knee  immobilizer Activity Tolerance: Patient tolerated treatment well;Patient limited by pain;Patient limited by fatigue Patient left: in bed;with call bell/phone within reach     Time: 1410-1443 PT Time Calculation (min): 33 min  Charges:  $Therapeutic Exercise: 23-37 mins                    G CodesMarella Bile 05-05-2014, 2:56 PM Marella Bile, PT Pager: [phone] 05/05/2014

## 2014-04-30 NOTE — Progress Notes (Signed)
   Subjective: 3 Days Post-Op Procedure(s) (LRB): RIGHT TOTAL KNEE ARTHROPLASTY (Right)  Still c/o moderate pain to right knee Therapy going slowly due to continued pain Recommend one more day of PT before d/c home Patient reports pain as moderate.  Objective:   VITALS:   Filed Vitals:   04/30/14 0615  BP: 139/73  Pulse: 103  Temp: 98.1 F (36.7 C)  Resp: 16    Right knee incision healing well nv intact distally No rashes or edema  LABS  Recent Labs  04/28/14 0447 04/29/14 0428 04/30/14 0524  HGB 12.4* 11.9* 11.2*  HCT 37.0* 34.8* 33.8*  WBC 9.9 11.2* 11.0*  PLT 157 156 181     Recent Labs  04/28/14 0447  NA 132*  K 4.2  BUN 14  CREATININE 0.88  GLUCOSE 148*     Assessment/Plan: 3 Days Post-Op Procedure(s) (LRB): RIGHT TOTAL KNEE ARTHROPLASTY (Right)  Continue PT/OT Plan for d/c tomorrow Pain control as needed   Alphonsa OverallBrad Alasia Enge, MPAS, PA-C  04/30/2014, 6:59 AM

## 2014-05-01 MED ORDER — LORAZEPAM 1 MG PO TABS: 1.0000 mg | ORAL_TABLET | Freq: Four times a day (QID) | ORAL | Status: DC | PRN

## 2014-05-01 NOTE — Progress Notes (Signed)
Physical Therapy Treatment Patient Details Name: Jose Webb MRN: [id number] DOB: [birth year] Today's Date: 05/01/2014    History of Present Illness s/p R TKR    PT Comments    POD # 4 pt now plans to D/C to home.  Spouse is a home health aide and very knowledge.  The extra day stay was beneficial.  Pt is more steady and able to get self better.  Assisted OOB pt amb to BR to void(sitting).  Pt required increased time and 50% VC's on safety with turns and tight spaces using RW.  Amb in hallway then practiced negotiating 2 steps with RW up back ward due to NO rails.  Spouse present and instructed on safe handling and proper tech.  Returned to room and applied ICE.  Will come back for second session to ensure safety and mobility family education.   Follow Up Recommendations  SNF (pt declines LPT rec for SNF so now plans to D/C to home with spouse.  Will update LPT. )     Equipment Recommendations       Recommendations for Other Services       Precautions / Restrictions Precautions Precautions: Knee;Fall Precaution Comments: back pain--s/p 2 sxs; bilat hand pain 2* arthritis Restrictions Weight Bearing Restrictions: No Other Position/Activity Restrictions: WBAT    Mobility  Bed Mobility Overal bed mobility: Needs Assistance Bed Mobility: Sit to Supine     Supine to sit: Mod assist;Min assist     General bed mobility comments: increased time  Transfers Overall transfer level: Needs assistance Equipment used: Rolling walker (2 wheeled) Transfers: Sit to/from Stand Sit to Stand: Min assist Stand pivot transfers: Min assist       General transfer comment: cues for UE/LE placement and sequence to perform safely  Ambulation/Gait Ambulation/Gait assistance: Min guard Ambulation Distance (Feet): 22 Feet Assistive device: Rolling walker (2 wheeled) Gait Pattern/deviations: Step-to pattern;Narrow base of support;Decreased stance time - right Gait velocity: decreased    General Gait Details: cues for posture, sequence, position from RW, increased UE WB and basic saftey awareness.  Again pt requires increased time to complete task.    Stairs Stairs: Yes Stairs assistance: Min assist Stair Management: No rails;Step to pattern;Backwards;With walker Number of Stairs: 2 General stair comments: with spouse present to assist and education.    Wheelchair Mobility    Modified Rankin (Stroke Patients Only)       Balance                                    Cognition Arousal/Alertness: Awake/alert Behavior During Therapy: WFL for tasks assessed/performed Overall Cognitive Status: Within Functional Limits for tasks assessed                      Exercises      General Comments        Pertinent Vitals/Pain C/o 8/10 pain Pre medicated ICE applied    Home Living                      Prior Function            PT Goals (current goals can now be found in the care plan section) Progress towards PT goals: Progressing toward goals    Frequency  7X/week    PT Plan      Co-evaluation  End of Session Equipment Utilized During Treatment: Gait belt (did not use KI POD # 4 and encourage knee flex with sit/stand) Activity Tolerance: Patient tolerated treatment well;Patient limited by pain Patient left: in chair;with call bell/phone within reach;with family/visitor present     Time: [id number] PT Time Calculation (min): 40 min  Charges:  $Gait Training: 23-37 mins $Therapeutic Activity: 8-22 mins                    G Codes:      Felecia ShellingLori Shterna Laramee  PTA [facility]  Acute  Rehab Pager      [phone]

## 2014-05-01 NOTE — Discharge Summary (Signed)
Physician Discharge Summary   Patient ID: Jose Webb MRN: [id number] DOB/AGE: 57-29-1957 57 y.o.  Admit date: 04/27/2014 Discharge date: 05/01/2014  Primary Diagnosis: right knee DJD  Admission Diagnoses:  Past Medical History  Diagnosis Date  . GERD (gastroesophageal reflux disease)   . Depression   . Allergy   . Hyperlipidemia   . Hypertension   . Hearing loss     Tinnitus" bilateral" - no hearing aids  . Asthma     Younger years"premature birth infant"  . Insomnia   . Arthritis     Degenerative disc disease  . Fracture     left lower leg compound fracture-"set"   Discharge Diagnoses:   Principal Problem:   Right knee DJD  Estimated body mass index is 31.46 kg/(m^2) as calculated from the following:   Height as of this encounter: 6' (1.829 m).   Weight as of this encounter: 105.235 kg (232 lb).  Procedure:  Procedure(s) (LRB): RIGHT TOTAL KNEE ARTHROPLASTY (Right)   Consults: None  HPI: see H&P Laboratory Data: Admission on 04/27/2014, Discharged on 05/01/2014  Component Date Value Ref Range Status  . WBC 04/28/2014 9.9  4.0 - 10.5 K/uL Final  . RBC 04/28/2014 4.04* 4.22 - 5.81 MIL/uL Final  . Hemoglobin 04/28/2014 12.4* 13.0 - 17.0 g/dL Final  . HCT 04/28/2014 37.0* 39.0 - 52.0 % Final  . MCV 04/28/2014 91.6  78.0 - 100.0 fL Final  . MCH 04/28/2014 30.7  26.0 - 34.0 pg Final  . MCHC 04/28/2014 33.5  30.0 - 36.0 g/dL Final  . RDW 04/28/2014 12.1  11.5 - 15.5 % Final  . Platelets 04/28/2014 157  150 - 400 K/uL Final  . Sodium 04/28/2014 132* 137 - 147 mEq/L Final  . Potassium 04/28/2014 4.2  3.7 - 5.3 mEq/L Final  . Chloride 04/28/2014 96  96 - 112 mEq/L Final  . CO2 04/28/2014 24  19 - 32 mEq/L Final  . Glucose, Bld 04/28/2014 148* 70 - 99 mg/dL Final  . BUN 04/28/2014 14  6 - 23 mg/dL Final  . Creatinine, Ser 04/28/2014 0.88  0.50 - 1.35 mg/dL Final  . Calcium 04/28/2014 8.8  8.4 - 10.5 mg/dL Final  . GFR calc non Af Amer 04/28/2014 >90  >90 mL/min  Final  . GFR calc Af Amer 04/28/2014 >90  >90 mL/min Final   Comment: (NOTE)                          The eGFR has been calculated using the CKD EPI equation.                          This calculation has not been validated in all clinical situations.                          eGFR's persistently <90 mL/min signify possible Chronic Kidney                          Disease.  . Anion gap 04/28/2014 12  5 - 15 Final  . WBC 04/29/2014 11.2* 4.0 - 10.5 K/uL Final  . RBC 04/29/2014 3.86* 4.22 - 5.81 MIL/uL Final  . Hemoglobin 04/29/2014 11.9* 13.0 - 17.0 g/dL Final  . HCT 04/29/2014 34.8* 39.0 - 52.0 % Final  . MCV 04/29/2014 90.2  78.0 - 100.0 fL Final  .  MCH 04/29/2014 30.8  26.0 - 34.0 pg Final  . MCHC 04/29/2014 34.2  30.0 - 36.0 g/dL Final  . RDW 04/29/2014 12.3  11.5 - 15.5 % Final  . Platelets 04/29/2014 156  150 - 400 K/uL Final  . WBC 04/30/2014 11.0* 4.0 - 10.5 K/uL Final  . RBC 04/30/2014 3.67* 4.22 - 5.81 MIL/uL Final  . Hemoglobin 04/30/2014 11.2* 13.0 - 17.0 g/dL Final  . HCT 04/30/2014 33.8* 39.0 - 52.0 % Final  . MCV 04/30/2014 92.1  78.0 - 100.0 fL Final  . MCH 04/30/2014 30.5  26.0 - 34.0 pg Final  . MCHC 04/30/2014 33.1  30.0 - 36.0 g/dL Final  . RDW 04/30/2014 12.3  11.5 - 15.5 % Final  . Platelets 04/30/2014 181  150 - 400 K/uL Final  Hospital Outpatient Visit on 04/18/2014  Component Date Value Ref Range Status  . Sodium 04/18/2014 141  137 - 147 mEq/L Final  . Potassium 04/18/2014 4.5  3.7 - 5.3 mEq/L Final  . Chloride 04/18/2014 103  96 - 112 mEq/L Final  . CO2 04/18/2014 27  19 - 32 mEq/L Final  . Glucose, Bld 04/18/2014 112* 70 - 99 mg/dL Final  . BUN 04/18/2014 18  6 - 23 mg/dL Final  . Creatinine, Ser 04/18/2014 0.88  0.50 - 1.35 mg/dL Final  . Calcium 04/18/2014 9.7  8.4 - 10.5 mg/dL Final  . GFR calc non Af Amer 04/18/2014 >90  >90 mL/min Final  . GFR calc Af Amer 04/18/2014 >90  >90 mL/min Final   Comment: (NOTE)                          The eGFR has been  calculated using the CKD EPI equation.                          This calculation has not been validated in all clinical situations.                          eGFR's persistently <90 mL/min signify possible Chronic Kidney                          Disease.  . WBC 04/18/2014 6.2  4.0 - 10.5 K/uL Final  . RBC 04/18/2014 4.84  4.22 - 5.81 MIL/uL Final  . Hemoglobin 04/18/2014 14.8  13.0 - 17.0 g/dL Final  . HCT 04/18/2014 43.2  39.0 - 52.0 % Final  . MCV 04/18/2014 89.3  78.0 - 100.0 fL Final  . MCH 04/18/2014 30.6  26.0 - 34.0 pg Final  . MCHC 04/18/2014 34.3  30.0 - 36.0 g/dL Final  . RDW 04/18/2014 11.9  11.5 - 15.5 % Final  . Platelets 04/18/2014 182  150 - 400 K/uL Final  . Prothrombin Time 04/18/2014 12.4  11.6 - 15.2 seconds Final  . INR 04/18/2014 0.92  0.00 - 1.49 Final  . ABO/RH(D) 04/18/2014 A POS   Final  . Antibody Screen 04/18/2014 NEG   Final  . Sample Expiration 04/18/2014 04/30/2014   Final  . Color, Urine 04/18/2014 YELLOW  YELLOW Final  . APPearance 04/18/2014 CLEAR  CLEAR Final  . Specific Gravity, Urine 04/18/2014 1.019  1.005 - 1.030 Final  . pH 04/18/2014 6.0  5.0 - 8.0 Final  . Glucose, UA 04/18/2014 NEGATIVE  NEGATIVE mg/dL Final  . Hgb urine dipstick  04/18/2014 TRACE* NEGATIVE Final  . Bilirubin Urine 04/18/2014 NEGATIVE  NEGATIVE Final  . Ketones, ur 04/18/2014 NEGATIVE  NEGATIVE mg/dL Final  . Protein, ur 04/18/2014 NEGATIVE  NEGATIVE mg/dL Final  . Urobilinogen, UA 04/18/2014 0.2  0.0 - 1.0 mg/dL Final  . Nitrite 04/18/2014 NEGATIVE  NEGATIVE Final  . Leukocytes, UA 04/18/2014 NEGATIVE  NEGATIVE Final  . MRSA, PCR 04/18/2014 NEGATIVE  NEGATIVE Final  . Staphylococcus aureus 04/18/2014 NEGATIVE  NEGATIVE Final   Comment:                                 The Xpert SA Assay (FDA                          approved for NASAL specimens                          in patients over 59 years of age),                          is one component of                          a  comprehensive surveillance                          program.  Test performance has                          been validated by [employer] for patients greater                          than or equal to 16 year old.                          It is not intended                          to diagnose infection nor to                          guide or monitor treatment.  . ABO/RH(D) 04/18/2014 A POS   Final  . Squamous Epithelial / LPF 04/18/2014 RARE  RARE Final  . RBC / HPF 04/18/2014 0-2  <3 RBC/hpf Final     X-Rays:Dg Chest 2 View  04/18/2014   CLINICAL DATA:  Preoperative knee arthroplasty ; hypertension  EXAM: CHEST  2 VIEW  COMPARISON:  January 25, 2007  FINDINGS: There is no edema or consolidation. The heart size and pulmonary vascularity are normal. No adenopathy. Mild anterior wedging of a lower thoracic vertebral body is stable.  IMPRESSION: No edema or consolidation.   Electronically Signed   By: Lowella Grip M.D.   On: 04/18/2014 14:21   Dg Knee 1-2 Views Right  04/27/2014   CLINICAL DATA:  Postop right total knee replacement.  EXAM: RIGHT KNEE - 1-2 VIEW  COMPARISON:  04/18/2014  FINDINGS: Patient is status post tricompartmental knee replacement.  No evidence for immediate hardware complications. Gas in the soft tissues is compatible with immediate postoperative state. Skin staples are seen anteriorly. Surgical drain overlies the anterior soft tissues.  IMPRESSION: Status post tricompartmental knee replacement without evidence of immediate hardware complications.   Electronically Signed   By: Misty Stanley M.D.   On: 04/27/2014 11:02   Dg Knee 1-2 Views Right  04/18/2014   CLINICAL DATA:  Pre-op for right total knee arthroplasty.  EXAM: RIGHT KNEE - 1-2 VIEW  COMPARISON:  None.  FINDINGS: The mineralization and alignment are normal. There is medial joint space loss with small osteophytes. Meniscal chondrocalcinosis is present both medially and laterally. The lateral  view shows evidence of a small joint effusion. No acute osseous findings are evident.  IMPRESSION: Medial compartment joint space loss with meniscal chondrocalcinosis and small joint effusion. No acute osseous findings.   Electronically Signed   By: Camie Patience M.D.   On: 04/18/2014 14:26    EKG: Orders placed during the hospital encounter of 04/27/14  . EKG 12-LEAD  . EKG 12-LEAD     Hospital Course: Jose Webb is a 58 y.o. who was admitted to [facility]. They were brought to the operating room on 04/27/2014 and underwent Procedure(s): RIGHT TOTAL KNEE ARTHROPLASTY.  Patient tolerated the procedure well and was later transferred to the recovery room and then to the orthopaedic floor for postoperative care.  They were given PO and IV analgesics for pain control following their surgery.  They were given 24 hours of postoperative antibiotics of  Anti-infectives   Start     Dose/Rate Route Frequency Ordered Stop   04/27/14 1430  ceFAZolin (ANCEF) IVPB 2 g/50 mL premix     2 g 100 mL/hr over 30 Minutes Intravenous Every 6 hours 04/27/14 1348 04/27/14 2018   04/27/14 0845  polymyxin B 500,000 Units, bacitracin 50,000 Units in sodium chloride irrigation 0.9 % 500 mL irrigation  Status:  Discontinued       As needed 04/27/14 0915 04/27/14 0940   04/27/14 0600  ceFAZolin (ANCEF) IVPB 2 g/50 mL premix     2 g 100 mL/hr over 30 Minutes Intravenous On call to O.R. 04/27/14 3790 04/27/14 0738     and started on DVT prophylaxis in the form of Xarelto, TED hose and SCD's.   PT and OT were ordered for total joint protocol.  Discharge planning consulted to help with postop disposition and equipment needs.  Patient had a difficult, painful night on the evening of surgery.  They started to get up OOB with therapy on day one. Hemovac drain was pulled without difficulty.  Continued to work with therapy into day two, slow to progress with poor pain control despite appropriate pain medication. By day  four, the patient had progressed with therapy and meeting their goals.  Incision was healing well.  Patient was seen in rounds and was ready to go home.   Diet: Regular diet Activity:WBAT Follow-up:in 10-14 days Disposition - Home with HHPT Discharged Condition: good   Discharge Instructions   Call MD / Call 911    Complete by:  As directed   If you experience chest pain or shortness of breath, CALL 911 and be transported to the hospital emergency room.  If you develope a fever above 101 F, pus (white drainage) or increased drainage or redness at the wound, or calf pain, call your surgeon's office.     Constipation Prevention    Complete by:  As directed   Drink plenty of fluids.  Prune juice may be helpful.  You may use a stool softener, such as Colace (over the counter) 100 mg twice a day.  Use MiraLax (over the counter) for constipation as needed.     Diet - low sodium heart healthy    Complete by:  As directed      Increase activity slowly as tolerated    Complete by:  As directed             Medication List    STOP taking these medications       aspirin EC 81 MG tablet     fish oil-omega-3 fatty acids 1000 MG capsule     multivitamin with minerals Tabs tablet      TAKE these medications       diazepam 5 MG tablet  Commonly known as:  VALIUM  Take 1 tablet (5 mg total) by mouth at bedtime as needed for anxiety.     diphenhydrAMINE 25 MG tablet  Commonly known as:  BENADRYL  Take 25 mg by mouth every 6 (six) hours as needed for itching.     docusate sodium 100 MG capsule  Commonly known as:  COLACE  Take 1 capsule (100 mg total) by mouth 2 (two) times daily as needed for mild constipation.     HYDROmorphone 2 MG tablet  Commonly known as:  DILAUDID  Take 1-2 tablets (2-4 mg total) by mouth every 4 (four) hours as needed for severe pain.     LORazepam 1 MG tablet  Commonly known as:  ATIVAN  Take 1-2 tablets (1-2 mg total) by mouth every 6 (six) hours as  needed for anxiety. Do not take with valium     losartan 100 MG tablet  Commonly known as:  COZAAR  Take 100 mg by mouth every morning.     methocarbamol 500 MG tablet  Commonly known as:  ROBAXIN  Take 1 tablet (500 mg total) by mouth 3 (three) times daily between meals as needed for muscle spasms.     omeprazole 20 MG capsule  Commonly known as:  PRILOSEC  Take 1 capsule (20 mg total) by mouth daily.     oxyCODONE-acetaminophen 7.5-325 MG per tablet  Commonly known as:  PERCOCET  Take 1 tablet by mouth every 4 (four) hours as needed for pain.     oxyCODONE-acetaminophen 7.5-325 MG per tablet  Commonly known as:  PERCOCET  Take 1-2 tablets by mouth every 4 (four) hours as needed for pain.     rivaroxaban 10 MG Tabs tablet  Commonly known as:  XARELTO  Take 1 tablet (10 mg total) by mouth daily.     vitamin C 500 MG tablet  Commonly known as:  ASCORBIC ACID  Take 1,000 mg by mouth daily.           Follow-up Information   Follow up with BEANE,JEFFREY C, MD In 2 weeks. (For suture removal)    Specialty:  Orthopedic Surgery   Contact information:   [address] [place] [address] [phone]       Follow up with [facility])    Contact information:   [address] [place] [address] [phone]       Signed: Lacie Draft, PA-C Orthopaedic Surgery 05/01/2014, 1:40 PM

## 2014-05-01 NOTE — Progress Notes (Signed)
Physical Therapy Treatment Patient Details Name: Jose Webb MRN: [id number] DOB: 08-02-1956 Today's Date: 05/01/2014    History of Present Illness s/p R TKR    PT Comments    POD #4 pm session.  Assisted pt with dressing and applying shoes.  Amb pt in hallway with B shoes to address safety.  Pt required increased time and <25% VC's safety wearing his work boots (heavy).  Pt and spouse instructed on HEP and given handout.  Pt and spouse instructed on activity level and use of ICE.  Spouse instructed on safe handling tech esp posterior guarding when pt turns and steps backward.    Follow Up Recommendations  Home health PT     Equipment Recommendations       Recommendations for Other Services       Precautions / Restrictions Precautions Precautions: Knee;Fall Precaution Comments: back pain--s/p 2 sxs; bilat hand pain 2* arthritis Restrictions Weight Bearing Restrictions: No Other Position/Activity Restrictions: WBAT    Mobility  Bed Mobility Overal bed mobility: Needs Assistance Bed Mobility: Sit to Supine     Supine to sit: Mod assist;Min assist     General bed mobility comments: Pt OOB in recliner  Transfers Overall transfer level: Needs assistance Equipment used: Rolling walker (2 wheeled) Transfers: Sit to/from Stand Sit to Stand: Min guard Stand pivot transfers: Min assist       General transfer comment: <25% cues for UE/LE placement and sequence to perform safely  Ambulation/Gait Ambulation/Gait assistance: Supervision;Min guard Ambulation Distance (Feet): 58 Feet Assistive device: Rolling walker (2 wheeled) Gait Pattern/deviations: Step-to pattern;Narrow base of support;Decreased stance time - right Gait velocity: decreased   General Gait Details: Pt c/o B hand pain/weakness "arthritis" and L knee pain/weakness.  Pt does place excessive weight thru RW.  Pt required increased time to complete task.     Wheelchair Mobility    Modified Rankin  (Stroke Patients Only)       Balance                                    Cognition Arousal/Alertness: Awake/alert Behavior During Therapy: WFL for tasks assessed/performed Overall Cognitive Status: Within Functional Limits for tasks assessed                      Exercises      General Comments        Pertinent Vitals/Pain C/o 6/10 ICE applied    Home Living                      Prior Function            PT Goals (current goals can now be found in the care plan section) Progress towards PT goals: Progressing toward goals    Frequency  7X/week    PT Plan      Co-evaluation             End of Session Equipment Utilized During Treatment: Gait belt Activity Tolerance: Patient tolerated treatment well;Patient limited by pain Patient left: in chair;with call bell/phone within reach;with family/visitor present     Time: 4098-1191 PT Time Calculation (min): 25 min  Charges:  $Gait Training: 8-22 mins $Therapeutic Activity: 8-22 mins                    G Codes:      Armando Reichert  05/01/2014, 1:25 PM

## 2014-05-01 NOTE — Progress Notes (Signed)
Subjective: 4 Days Post-Op Procedure(s) (LRB): RIGHT TOTAL KNEE ARTHROPLASTY (Right) Patient reports pain as moderate. Reporting ongoing pain, still tearful. Refuses to go to a SNF and feels he will be fine at home being able to sit in his recliner and elevate. He is aware the importance of regaining his motion by working with PT but has done very little while here. He wants to go home today. His wife is concerned about him going home but also has her daughter and extra help coming to help care for him. She does have experience in personal care. She is requesting something for anxiety for him at home.  Objective: Vital signs in last 24 hours: Temp:  [98 F (36.7 C)-100.4 F (38 C)] 98.1 F (36.7 C) (07/13 0525) Pulse Rate:  [96-117] 96 (07/13 0525) Resp:  [16-20] 16 (07/13 0525) BP: (126-147)/(69-72) 126/72 mmHg (07/13 0525) SpO2:  [95 %-98 %] 95 % (07/13 0525)  Intake/Output from previous day: 07/12 0701 - 07/13 0700 In: 840 [P.O.:840] Out: 2 [Urine:2] Intake/Output this shift:     Recent Labs  04/29/14 0428 04/30/14 0524  HGB 11.9* 11.2*    Recent Labs  04/29/14 0428 04/30/14 0524  WBC 11.2* 11.0*  RBC 3.86* 3.67*  HCT 34.8* 33.8*  PLT 156 181   No results found for this basename: NA, K, CL, CO2, BUN, CREATININE, GLUCOSE, CALCIUM,  in the last 72 hours No results found for this basename: LABPT, INR,  in the last 72 hours  Neurologically intact ABD soft Neurovascular intact Sensation intact distally Intact pulses distally Dorsiflexion/Plantar flexion intact Incision: dressing C/D/I and scant drainage No cellulitis present Compartments soft No calf pain or sign of DVT Small blister lateral R knee Swelling RLE  Assessment/Plan: 4 Days Post-Op Procedure(s) (LRB): RIGHT TOTAL KNEE ARTHROPLASTY (Right) Advance diet Up with therapy WBAT Encouraged pt to get up and work with PT today and at home with HHPT to regain ROM Discussed possible need for manipulation  if he does not regain ROM Addressed need for proper nutrition and avoiding ETOH Add ativan for anxiety prn, discussed seeing his PCP for mgmt of this with his wife Will discuss with Dr. Shelle IronBeane Plan D/C home with HHPT today  Jose Webb, Jose Maxim M. 05/01/2014, 7:46 AM

## 2014-05-01 NOTE — Progress Notes (Signed)
Occupational Therapy Treatment Patient Details Name: Jose Webb MRN: [id number] DOB: [birth year] Today's Date: 05/01/2014    History of present illness s/p R TKR   OT comments  Wife will provide care as needed  Follow Up Recommendations  Supervision/Assistance - 24 hour (pt declining SNF)    Equipment Recommendations  3 in 1 bedside comode       Precautions / Restrictions Precautions Precautions: Knee;Fall Precaution Comments: back pain--s/p 2 sxs; bilat hand pain 2* arthritis Restrictions Weight Bearing Restrictions: No Other Position/Activity Restrictions: WBAT       Mobility Bed Mobility Overal bed mobility: Needs Assistance Bed Mobility: Sit to Supine     Supine to sit: Mod assist;Min assist     General bed mobility comments: increased time  Transfers Overall transfer level: Needs assistance Equipment used: Rolling walker (2 wheeled) Transfers: Sit to/from Stand Sit to Stand: Min assist Stand pivot transfers: Min assist       General transfer comment: cues for UE/LE placement and sequence to perform safely        ADL                                         General ADL Comments: Pt agreed to OOB . Pt overall min A with increased time to get OOB.  Pt needed VC but did not follow directions.  Pt min A with sit to stand but did not follow directions regarding hand placement.  Wife aware of correct technique and will encourage pt to use correc technique.                Cognition   Behavior During Therapy: WFL for tasks assessed/performed Overall Cognitive Status: Within Functional Limits for tasks assessed                               General Comments  increased time needed to perform all transitions           Frequency Min 2X/week     Progress Toward Goals  OT Goals(current goals can now be found in the care plan section)  Progress towards OT goals: Progressing toward goals     Plan Discharge plan  remains appropriate    Co-evaluation                 End of Session Equipment Utilized During Treatment: Gait belt;Rolling walker   Activity Tolerance Patient limited by pain   Patient Left in chair;with call bell/phone within reach;with family/visitor present   Nurse Communication          Time: 4098-11911230-1253 OT Time Calculation (min): 23 min  Charges: OT General Charges $OT Visit: 1 Procedure OT Treatments $Self Care/Home Management : 23-37 mins  Jose Webb, Jose Webb 05/01/2014, 1:03 PM

## 2014-05-19 ENCOUNTER — Telehealth: Payer: Self-pay | Admitting: Family Medicine

## 2014-05-19 NOTE — Telephone Encounter (Signed)
Recommend against regular use.  We have discussed in past.  May refill #20 and would not take nightly to avoid dependency.

## 2014-05-19 NOTE — Telephone Encounter (Signed)
Pt is needing new rx for diazepam (VALIUM) 5 MG tablet, send to [facility].

## 2014-05-19 NOTE — Telephone Encounter (Signed)
Please advise if refill okay

## 2014-05-22 MED ORDER — DIAZEPAM 5 MG PO TABS: 5.0000 mg | ORAL_TABLET | Freq: Every evening | ORAL | Status: DC | PRN

## 2014-05-22 NOTE — Telephone Encounter (Signed)
Spoke to pt, told him per Dr. Caryl NeverBurchette recommend against regular use and that they have discussed this in the past, would not take nightly to avoid dependency. Rx called into pharmacy. Pt verbalized understanding.

## 2014-08-11 ENCOUNTER — Ambulatory Visit (INDEPENDENT_AMBULATORY_CARE_PROVIDER_SITE_OTHER): Payer: No Typology Code available for payment source | Admitting: Family Medicine

## 2014-08-11 ENCOUNTER — Encounter: Payer: Self-pay | Admitting: Family Medicine

## 2014-08-11 VITALS — BP 146/80 | HR 90 | Temp 98.0°F | Wt 230.0 lb

## 2014-08-11 DIAGNOSIS — F411 Generalized anxiety disorder: Secondary | ICD-10-CM

## 2014-08-11 DIAGNOSIS — M25561 Pain in right knee: Secondary | ICD-10-CM

## 2014-08-11 MED ORDER — SERTRALINE HCL 50 MG PO TABS
50.0000 mg | ORAL_TABLET | Freq: Every day | ORAL | Status: DC
Start: 2014-08-11 — End: 2015-02-07
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
## 2014-08-11 NOTE — Progress Notes (Signed)
Pre visit review using our clinic review tool, if applicable. No additional management support is needed unless otherwise documented below in the visit note. 

## 2014-09-12 ENCOUNTER — Ambulatory Visit: Payer: No Typology Code available for payment source | Admitting: Family Medicine

## 2014-10-30 ENCOUNTER — Other Ambulatory Visit: Payer: Self-pay | Admitting: Family Medicine

## 2014-12-15 ENCOUNTER — Ambulatory Visit: Payer: No Typology Code available for payment source | Admitting: Family Medicine

## 2014-12-18 ENCOUNTER — Ambulatory Visit (INDEPENDENT_AMBULATORY_CARE_PROVIDER_SITE_OTHER): Payer: Self-pay | Admitting: Family Medicine

## 2014-12-18 DIAGNOSIS — M159 Polyosteoarthritis, unspecified: Secondary | ICD-10-CM

## 2014-12-18 DIAGNOSIS — M150 Primary generalized (osteo)arthritis: Secondary | ICD-10-CM

## 2014-12-18 NOTE — Progress Notes (Signed)
   Subjective:    Patient ID: Jose Webb, male    DOB: August 18, 1956, 59 y.o.   MRN: [id number]  HPI Patient is here basically discuss arthritis and disability issues. He apparently since October 2014 has been on so security disability. This apparently is on the basis of diffuse osteoarthritis. He has seen multiple orthopedists in the past had right total knee replacement last summer. He's had significant arthritis especially involving cervical spine as well as hands and multiple other joints. He previously worked as a Photographermachine maker. He has tried multiple conservative things including nonsteroidals and heat without much improvement.  Past Medical History  Diagnosis Date  . GERD (gastroesophageal reflux disease)   . Depression   . Allergy   . Hyperlipidemia   . Hypertension   . Hearing loss     Tinnitus" bilateral" - no hearing aids  . Asthma     Younger years"premature birth infant"  . Insomnia   . Arthritis     Degenerative disc disease  . Fracture     left lower leg compound fracture-"set"   Past Surgical History  Procedure Laterality Date  . Spine surgery      age 59 and 1997-total 2.  . Total knee arthroplasty Right 04/27/2014    Procedure: RIGHT TOTAL KNEE ARTHROPLASTY;  Surgeon: Javier DockerJeffrey C Beane, MD;  Location: [facility];  Service: Orthopedics;  Laterality: Right;    reports that he has never smoked. He has never used smokeless tobacco. He reports that he drinks alcohol. He reports that he does not use illicit drugs. family history includes Arthritis in his mother; Diabetes (age of onset: 1458) in his father; Heart disease in his father; Hypertension in his father and mother. There is no history of Colon cancer. Allergies  Allergen Reactions  . Percocet [Oxycodone-Acetaminophen] Rash      Review of Systems  Constitutional: Negative for appetite change and unexpected weight change.  Respiratory: Negative for shortness of breath.   Musculoskeletal: Positive for back pain,  arthralgias and neck pain. Negative for myalgias and joint swelling.  Neurological: Negative for weakness.       Objective:   Physical Exam  Constitutional: He appears well-developed and well-nourished.  Neck: Neck supple. No thyromegaly present.  Cardiovascular: Normal rate and regular rhythm.   Pulmonary/Chest: Effort normal and breath sounds normal. No respiratory distress. He has no wheezes. He has no rales.  Musculoskeletal: He exhibits no edema.          Assessment & Plan:  Patient has osteoarthritis involving multiple joints. Previous right total knee replacement. He's already on disability long-term. He is recently had some issues with disability insurance and is requesting a letter documenting his osteoarthritis history.

## 2014-12-18 NOTE — Progress Notes (Signed)
Pre visit review using our clinic review tool, if applicable. No additional management support is needed unless otherwise documented below in the visit note. 

## 2014-12-18 NOTE — Patient Instructions (Signed)

## 2015-02-07 ENCOUNTER — Encounter: Payer: Self-pay | Admitting: Family Medicine

## 2015-02-07 ENCOUNTER — Ambulatory Visit (INDEPENDENT_AMBULATORY_CARE_PROVIDER_SITE_OTHER): Payer: 59 | Admitting: Family Medicine

## 2015-02-07 VITALS — BP 140/78 | HR 74 | Temp 98.2°F | Wt 235.0 lb

## 2015-02-07 DIAGNOSIS — F411 Generalized anxiety disorder: Secondary | ICD-10-CM | POA: Diagnosis not present

## 2015-02-07 DIAGNOSIS — M25562 Pain in left knee: Secondary | ICD-10-CM | POA: Diagnosis not present

## 2015-02-07 MED ORDER — SERTRALINE HCL 100 MG PO TABS: 100.0000 mg | ORAL_TABLET | Freq: Every day | ORAL | Status: DC

## 2015-02-07 MED ORDER — LOSARTAN POTASSIUM 100 MG PO TABS: 100.0000 mg | ORAL_TABLET | Freq: Every day | ORAL | Status: DC

## 2015-02-07 MED ORDER — OMEPRAZOLE 20 MG PO CPDR: 20.0000 mg | DELAYED_RELEASE_CAPSULE | Freq: Every day | ORAL | Status: DC
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
## 2015-02-07 NOTE — Progress Notes (Signed)
Pre visit review using our clinic review tool, if applicable. No additional management support is needed unless otherwise documented below in the visit note. 

## 2015-02-24 ENCOUNTER — Other Ambulatory Visit: Payer: Self-pay | Admitting: Family Medicine

## 2015-06-04 ENCOUNTER — Other Ambulatory Visit: Payer: Self-pay | Admitting: Family Medicine

## 2015-11-18 ENCOUNTER — Other Ambulatory Visit: Payer: Self-pay | Admitting: Family Medicine

## 2015-12-02 ENCOUNTER — Other Ambulatory Visit: Payer: Self-pay | Admitting: Family Medicine

## 2016-01-27 ENCOUNTER — Other Ambulatory Visit: Payer: Self-pay | Admitting: Family Medicine

## 2016-02-11 IMAGING — CR DG KNEE 1-2V*R*
2 series · 2 of 2 positions shown · non-contrast
Comparison: None.

CLINICAL DATA: Pre-op for right total knee arthroplasty.

EXAM:
RIGHT KNEE - 1-2 VIEW

[t knee ap right]
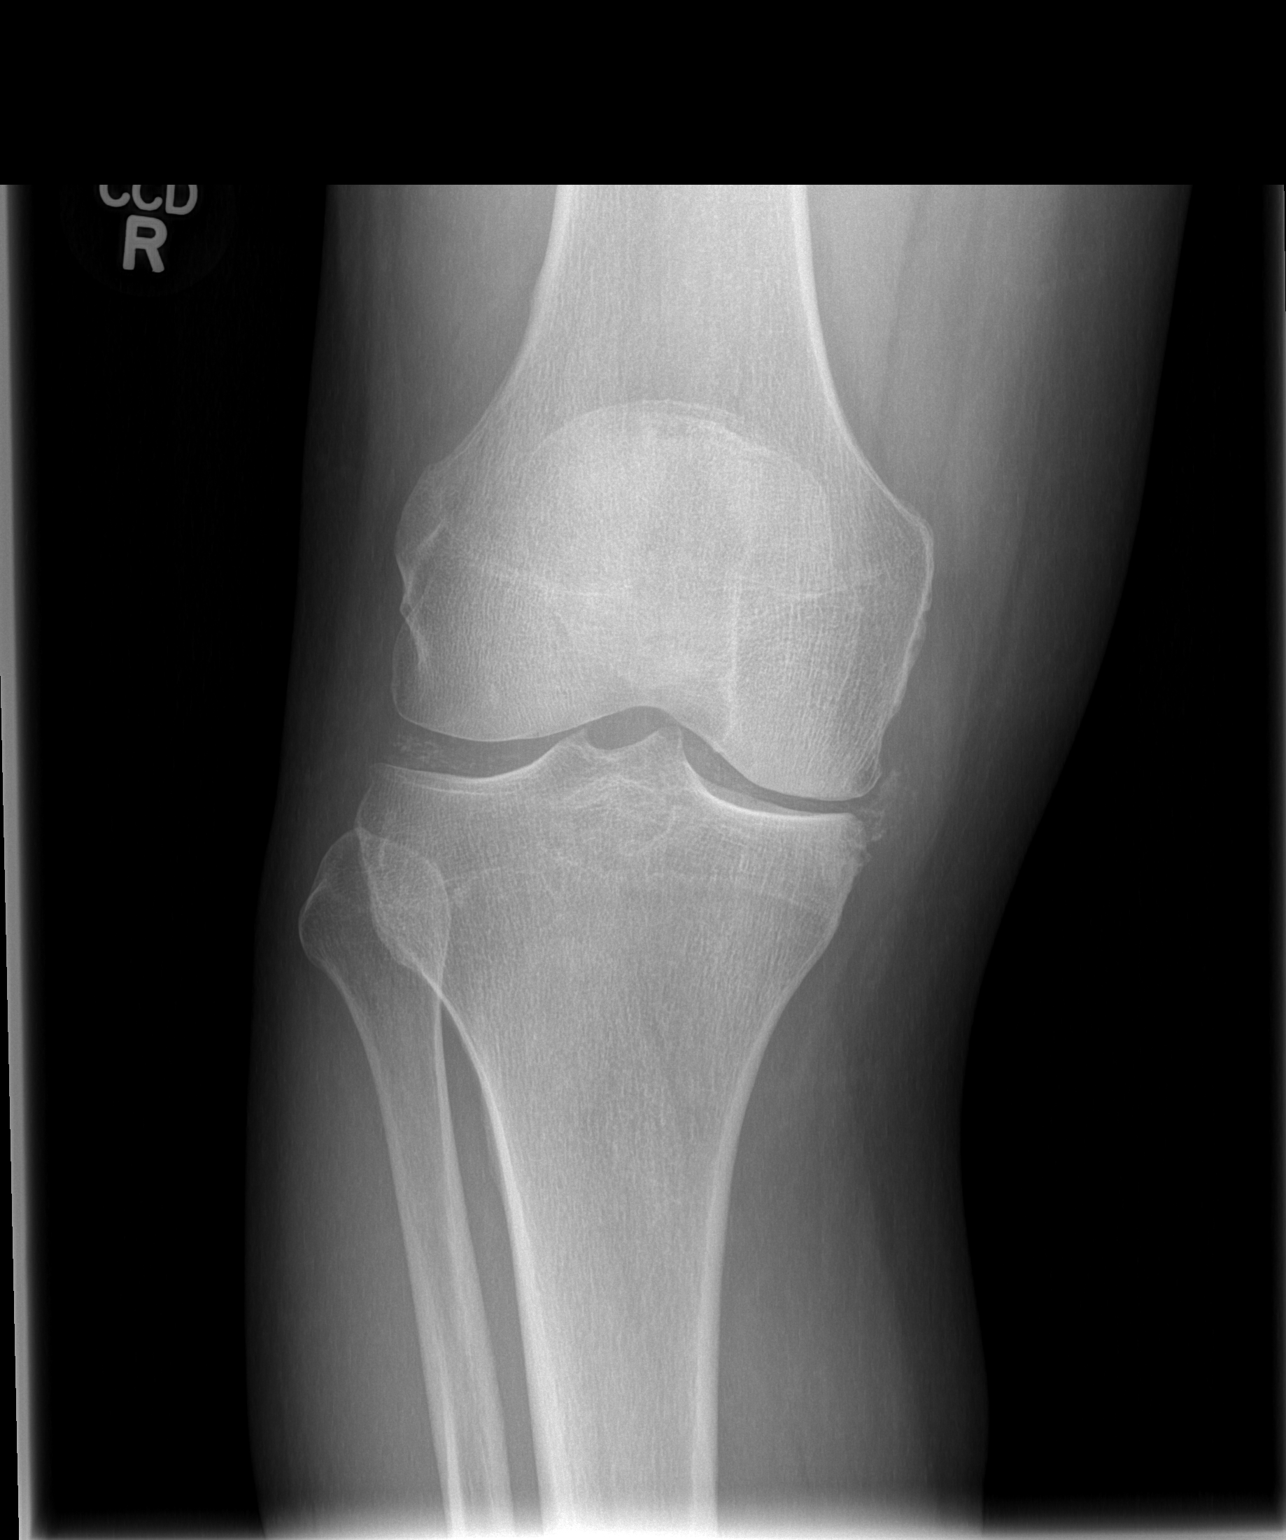

[t knee lat right]
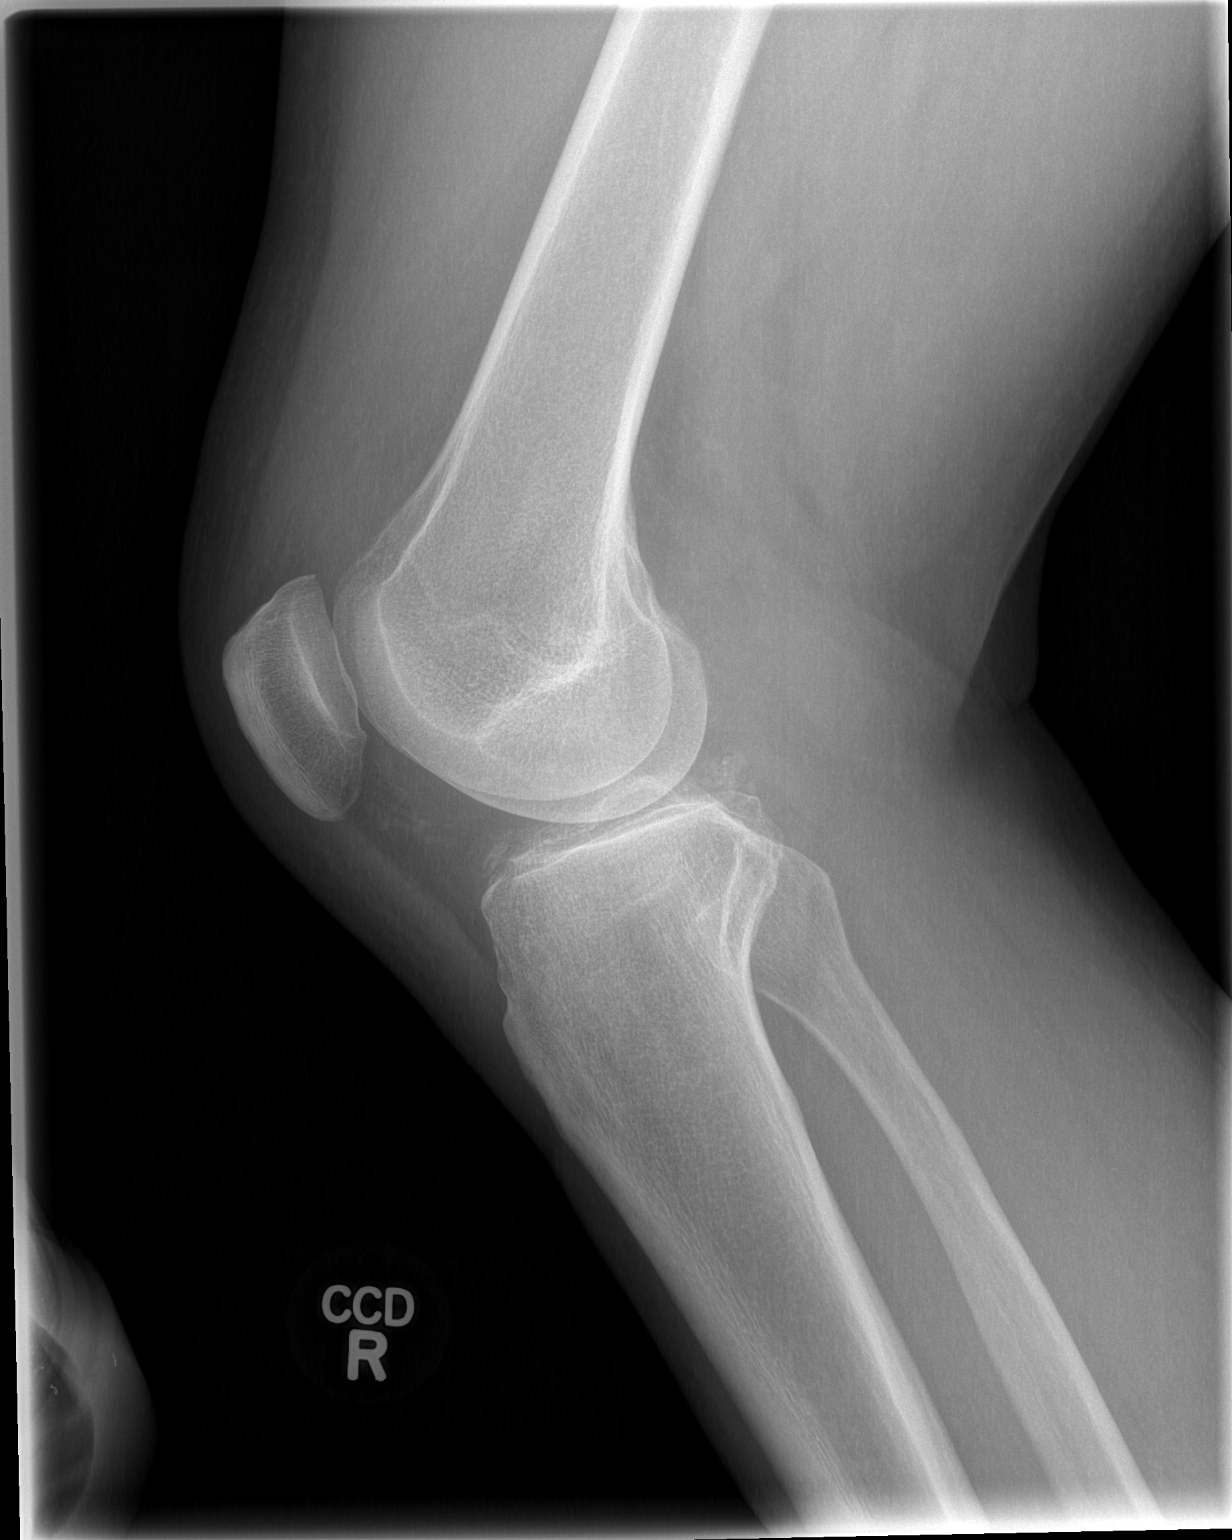

[2 of 2 positions shown; findings below may reference images not displayed]

FINDINGS: The mineralization and alignment are normal. There is medial joint
space loss with small osteophytes. Meniscal chondrocalcinosis is
present both medially and laterally. The lateral view shows evidence
of a small joint effusion. No acute osseous findings are evident.
IMPRESSION: Medial compartment joint space loss with meniscal chondrocalcinosis
and small joint effusion. No acute osseous findings.

## 2016-02-14 ENCOUNTER — Other Ambulatory Visit: Payer: Self-pay | Admitting: Adult Health

## 2016-02-14 NOTE — Telephone Encounter (Signed)
Dr Burchette patient 

## 2016-02-20 IMAGING — CR DG KNEE 1-2V*R*
1 series · 2 of 2 positions shown · non-contrast
Comparison: 04/18/2014

CLINICAL DATA: Postop right total knee replacement.

EXAM:
RIGHT KNEE - 1-2 VIEW

[Series 1: AP · right · 2 of 2 slices shown]
[im 1/2]
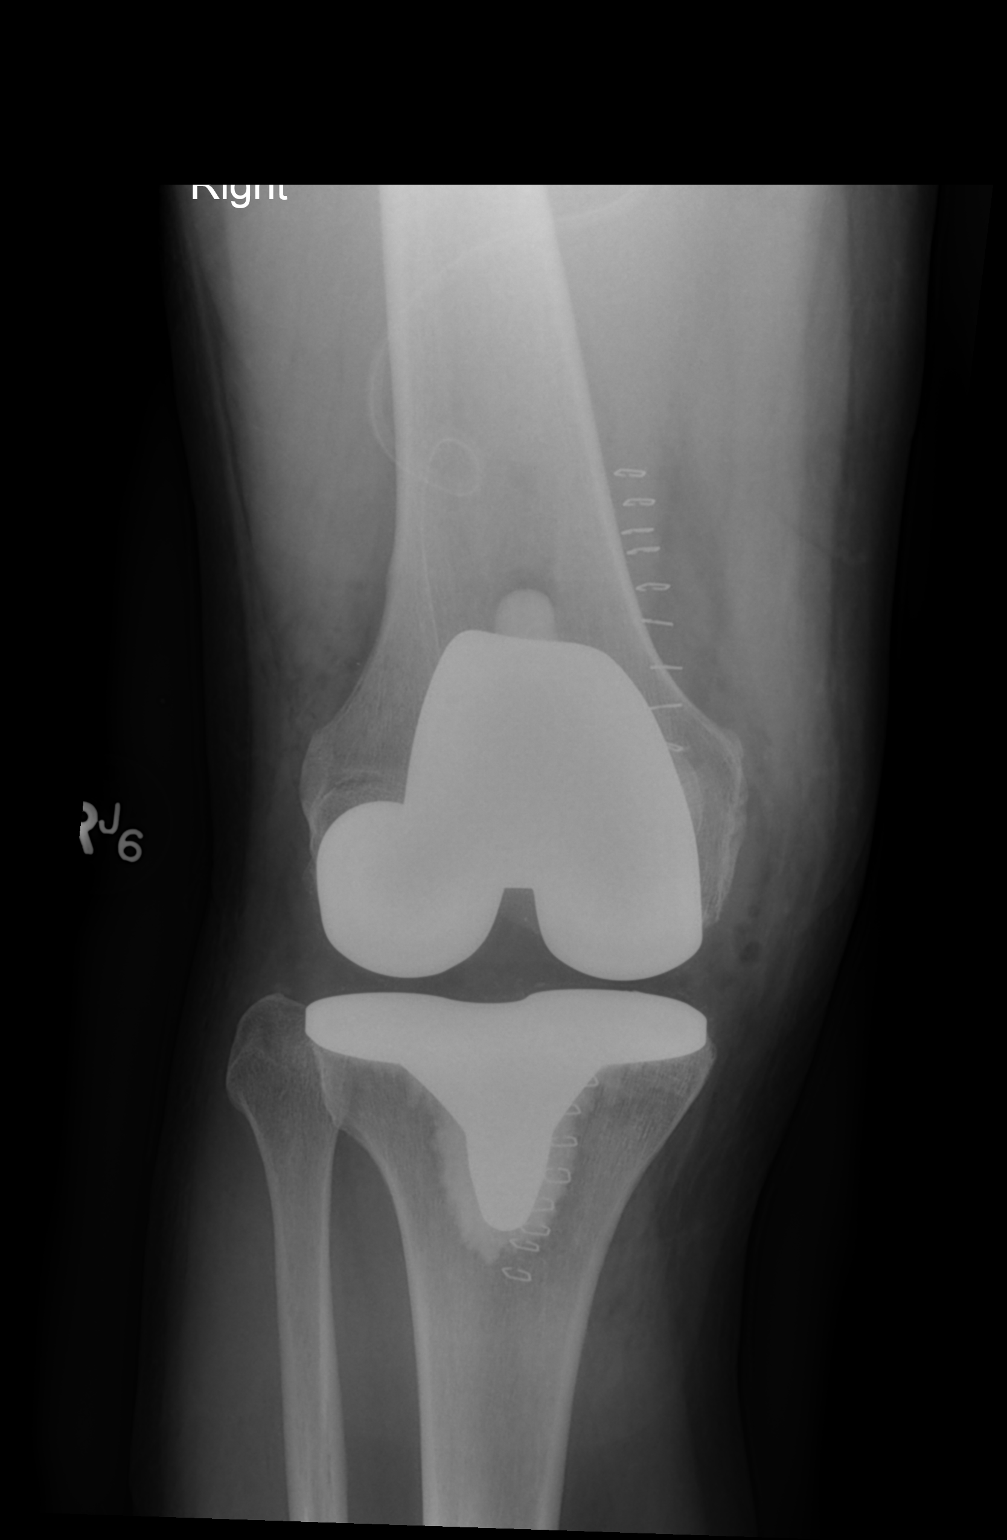
[im 2/2]
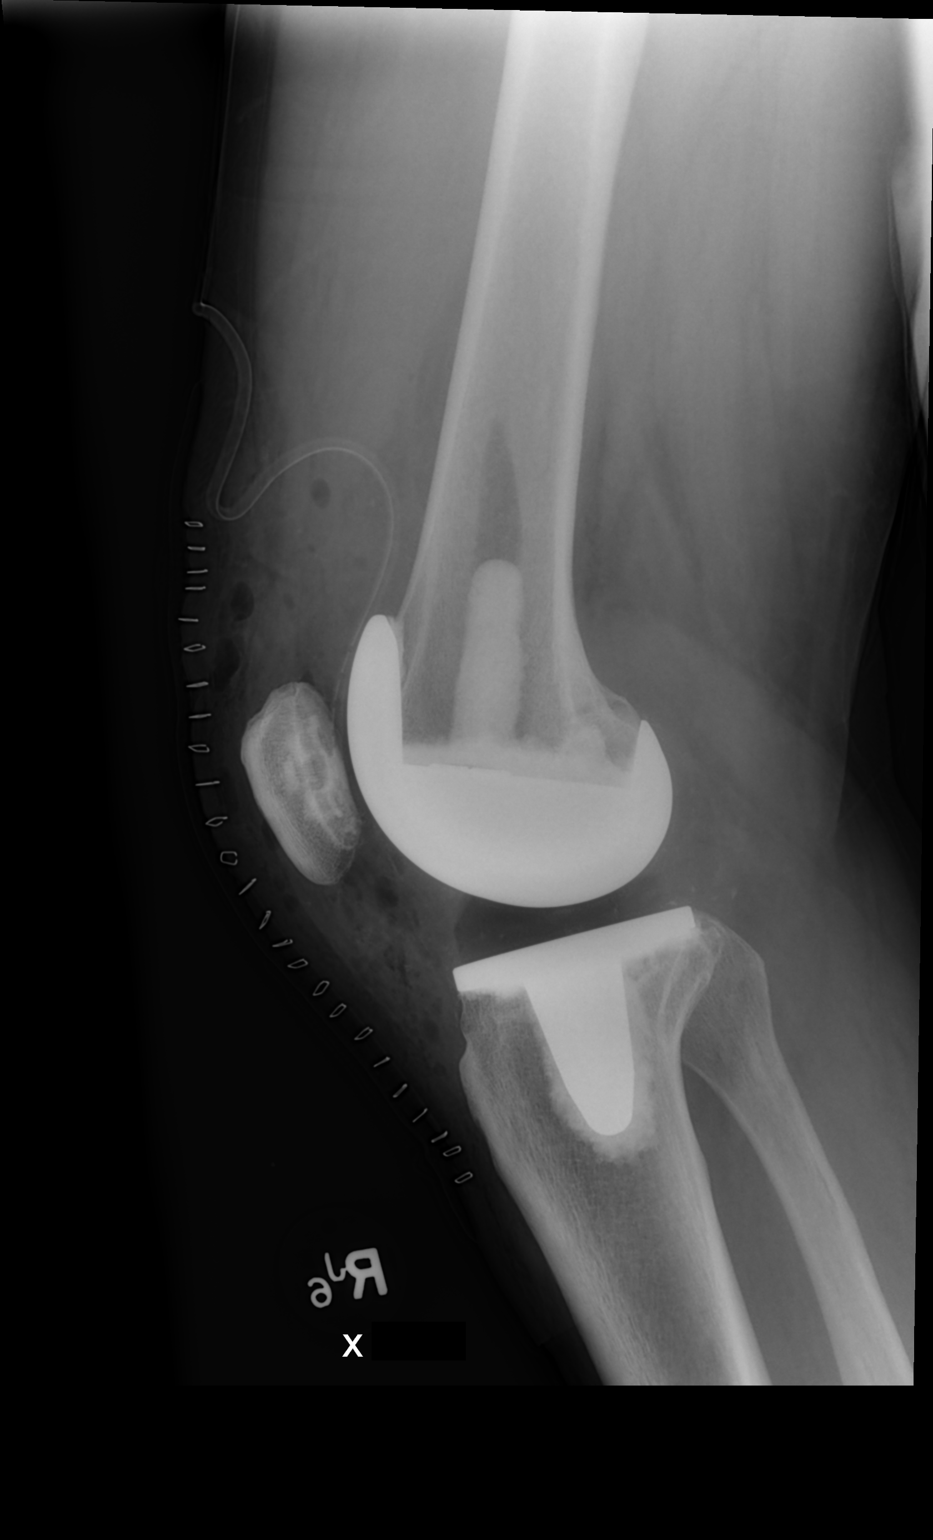

[2 of 2 positions shown; findings below may reference images not displayed]

FINDINGS: Patient is status post tricompartmental knee replacement. No
evidence for immediate hardware complications. Gas in the soft
tissues is compatible with immediate postoperative state. Skin
staples are seen anteriorly. Surgical drain overlies the anterior
soft tissues.
IMPRESSION: Status post tricompartmental knee replacement without evidence of
immediate hardware complications.

## 2016-02-24 ENCOUNTER — Other Ambulatory Visit: Payer: Self-pay | Admitting: Family Medicine

## 2016-03-25 ENCOUNTER — Other Ambulatory Visit: Payer: Self-pay | Admitting: Family Medicine

## 2016-04-16 ENCOUNTER — Ambulatory Visit (INDEPENDENT_AMBULATORY_CARE_PROVIDER_SITE_OTHER): Payer: BLUE CROSS/BLUE SHIELD | Admitting: Family Medicine

## 2016-04-16 ENCOUNTER — Encounter: Payer: Self-pay | Admitting: Family Medicine

## 2016-04-16 VITALS — BP 122/80 | HR 85 | Temp 98.0°F | Ht 72.0 in | Wt 226.0 lb

## 2016-04-16 DIAGNOSIS — M25512 Pain in left shoulder: Secondary | ICD-10-CM

## 2016-04-16 DIAGNOSIS — M25511 Pain in right shoulder: Secondary | ICD-10-CM

## 2016-04-16 DIAGNOSIS — R110 Nausea: Secondary | ICD-10-CM | POA: Diagnosis not present

## 2016-04-16 NOTE — Patient Instructions (Signed)
Decrease Zoloft to 50 mg once daily for one week and then discontinue.

## 2016-04-16 NOTE — Progress Notes (Signed)
Pre visit review using our clinic review tool, if applicable. No additional management support is needed unless otherwise documented below in the visit note. 

## 2016-04-16 NOTE — Progress Notes (Signed)
Subjective:    Patient ID: Jose FowlerSpurgeon T Geurts, male    DOB: [birth year], 60 y.o.   MRN: [id number]  HPI Patient seen with bilateral shoulder pain. He is actually followed by orthopedics in his early had MRIs of both shoulders and apparently nonsurgical shoulder pain. We have no records whatsoever. He's been treated with Norco but apparently had difficulty getting into see one of their pain management specialist. He is frustrated in not being mailed to get back and to see pain management there until August.  He states he has nightly pain and also daytime pain which is limiting day-to-day activities. He's been treated with Norco 10/325 mg in the past.  Has been on sertraline for chronic anxiety symptoms but does not fill those helped any. Currently takes 100 mg. He has some chronic low-grade nausea which he thinks is related. No vomiting. No abdominal pain. No change of appetite.  Past Medical History  Diagnosis Date  . GERD (gastroesophageal reflux disease)   . Depression   . Allergy   . Hyperlipidemia   . Hypertension   . Hearing loss     Tinnitus" bilateral" - no hearing aids  . Asthma     Younger years"premature birth infant"  . Insomnia   . Arthritis     Degenerative disc disease  . Fracture     left lower leg compound fracture-"set"   Past Surgical History  Procedure Laterality Date  . Spine surgery      age 60 and 1997-total 2.  . Total knee arthroplasty Right 04/27/2014    Procedure: RIGHT TOTAL KNEE ARTHROPLASTY;  Surgeon: Javier DockerJeffrey C Beane, MD;  Location: [facility];  Service: Orthopedics;  Laterality: Right;    reports that he has never smoked. He has never used smokeless tobacco. He reports that he drinks alcohol. He reports that he does not use illicit drugs. family history includes Arthritis in his mother; Diabetes (age of onset: 3058) in his father; Heart disease in his father; Hypertension in his father and mother. There is no history of Colon cancer. Allergies  Allergen  Reactions  . Percocet [Oxycodone-Acetaminophen] Rash      Review of Systems  Constitutional: Negative for appetite change and unexpected weight change.  Respiratory: Negative for cough and shortness of breath.   Cardiovascular: Negative for chest pain.       Objective:   Physical Exam  Constitutional: He appears well-developed and well-nourished.  Cardiovascular: Normal rate and regular rhythm.   Pulmonary/Chest: Effort normal and breath sounds normal. No respiratory distress. He has no wheezes. He has no rales.  Musculoskeletal:  Patient has full range of motion both shoulders but has pain with abduction greater than about 90. Also pain with internal rotation.          Assessment & Plan:  #1 bilateral shoulder pain. He's had extensive workup through orthopedics we have no records. We explained that we could not take over any pain management at this point without clear transition of records from his orthopedist. We have advised that he first check with orthopedics in trying to get earlier appointment in August. If that does not work out, we've given him alternative of setting up to see chronic pain management as apparently he does not have any surgical problem at this point regarding his bilateral shoulder pain  #2 nausea. Patient convinced this is related to SSRI. He is not sure he is getting any benefit from Zoloft at this point. Reduce to 50 mg once daily for  1 week and then discontinue. Touch base if nausea persists.  Kristian CoveyBruce W Darci Lykins MD [facility]

## 2016-04-23 ENCOUNTER — Other Ambulatory Visit: Payer: Self-pay | Admitting: Family Medicine

## 2016-05-17 ENCOUNTER — Other Ambulatory Visit: Payer: Self-pay | Admitting: Family Medicine

## 2016-09-03 DIAGNOSIS — M25562 Pain in left knee: Secondary | ICD-10-CM | POA: Diagnosis not present

## 2016-09-03 DIAGNOSIS — G8929 Other chronic pain: Secondary | ICD-10-CM | POA: Diagnosis not present

## 2016-09-03 DIAGNOSIS — M25511 Pain in right shoulder: Secondary | ICD-10-CM | POA: Diagnosis not present

## 2016-09-03 DIAGNOSIS — M25512 Pain in left shoulder: Secondary | ICD-10-CM | POA: Diagnosis not present

## 2016-10-07 ENCOUNTER — Other Ambulatory Visit: Payer: Self-pay | Admitting: Family Medicine

## 2016-11-17 ENCOUNTER — Other Ambulatory Visit: Payer: Self-pay | Admitting: Family Medicine

## 2016-12-03 DIAGNOSIS — M25511 Pain in right shoulder: Secondary | ICD-10-CM | POA: Diagnosis not present

## 2016-12-03 DIAGNOSIS — M25512 Pain in left shoulder: Secondary | ICD-10-CM | POA: Diagnosis not present

## 2016-12-03 DIAGNOSIS — G8929 Other chronic pain: Secondary | ICD-10-CM | POA: Diagnosis not present

## 2016-12-24 ENCOUNTER — Other Ambulatory Visit: Payer: Self-pay | Admitting: Family Medicine

## 2016-12-24 ENCOUNTER — Encounter: Payer: Self-pay | Admitting: Family Medicine

## 2016-12-24 ENCOUNTER — Ambulatory Visit (INDEPENDENT_AMBULATORY_CARE_PROVIDER_SITE_OTHER): Payer: PPO | Admitting: Family Medicine

## 2016-12-24 VITALS — BP 138/78 | HR 76 | Temp 98.9°F | Wt 230.0 lb

## 2016-12-24 DIAGNOSIS — R8290 Unspecified abnormal findings in urine: Secondary | ICD-10-CM

## 2016-12-24 DIAGNOSIS — R109 Unspecified abdominal pain: Secondary | ICD-10-CM

## 2016-12-24 LAB — POCT URINALYSIS DIPSTICK
BILIRUBIN UA: NEGATIVE
GLUCOSE UA: NEGATIVE
Ketones, UA: NEGATIVE
LEUKOCYTES UA: NEGATIVE
NITRITE UA: NEGATIVE
Protein, UA: NEGATIVE
Spec Grav, UA: 1.025
Urobilinogen, UA: 0.2
pH, UA: 5.5

## 2016-12-24 LAB — BASIC METABOLIC PANEL
BUN: 16 mg/dL (ref 6–23)
CALCIUM: 10.3 mg/dL (ref 8.4–10.5)
CO2: 29 meq/L (ref 19–32)
Chloride: 103 mEq/L (ref 96–112)
Creatinine, Ser: 0.85 mg/dL (ref 0.40–1.50)
GFR: 97.88 mL/min (ref >60.00)
GLUCOSE: 84 mg/dL (ref 70–99)
POTASSIUM: 4.1 meq/L (ref 3.5–5.1)
Sodium: 139 mEq/L (ref 135–145)

## 2016-12-24 NOTE — Progress Notes (Signed)
Pre visit review using our clinic review tool, if applicable. No additional management support is needed unless otherwise documented below in the visit note. 

## 2016-12-24 NOTE — Progress Notes (Signed)
Subjective:     Patient ID: Jose FowlerSpurgeon T Segal, male   DOB: 02-08-1956, 61 y.o.   MRN: [id number]  HPI Patient seen with concern for possible "kidney pain". He takes losartan 100 mg daily for hypertension and has been taking omeprazole for GERD issues. He saw some reports about lawsuits regarding "kidney failure" with omeprazole. He states for 6 months he's had some bilateral flank pain and was concerned that his kidneys were "failing". He's not had any edema issues or other concerns. He states that several weeks ago his urine was very dark in appearance for several days. No gross hematuria. No history of kidney stones. No appetite or weight changes. Denies recent jaundice or abdominal pain.  Past Medical History:  Diagnosis Date  . Allergy   . Arthritis    Degenerative disc disease  . Asthma    Younger years"premature birth infant"  . Depression   . Fracture    left lower leg compound fracture-"set"  . GERD (gastroesophageal reflux disease)   . Hearing loss    Tinnitus" bilateral" - no hearing aids  . Hyperlipidemia   . Hypertension   . Insomnia    Past Surgical History:  Procedure Laterality Date  . SPINE SURGERY     age 61 and 1997-total 2.  . TOTAL KNEE ARTHROPLASTY Right 04/27/2014   Procedure: RIGHT TOTAL KNEE ARTHROPLASTY;  Surgeon: Javier DockerJeffrey C Beane, MD;  Location: [facility];  Service: Orthopedics;  Laterality: Right;    reports that he has never smoked. He has never used smokeless tobacco. He reports that he drinks alcohol. He reports that he does not use drugs. family history includes Arthritis in his mother; Diabetes (age of onset: 4258) in his father; Heart disease in his father; Hypertension in his father and mother. Allergies  Allergen Reactions  . Percocet [Oxycodone-Acetaminophen] Rash     Review of Systems  Constitutional: Negative for fatigue.  Eyes: Negative for visual disturbance.  Respiratory: Negative for cough, chest tightness and shortness of breath.    Cardiovascular: Negative for chest pain, palpitations and leg swelling.  Neurological: Negative for dizziness, syncope, weakness, light-headedness and headaches.       Objective:   Physical Exam  Constitutional: He is oriented to person, place, and time. He appears well-developed and well-nourished.  HENT:  Right Ear: External ear normal.  Left Ear: External ear normal.  Mouth/Throat: Oropharynx is clear and moist.  Eyes: Pupils are equal, round, and reactive to light.  Neck: Neck supple. No thyromegaly present.  Cardiovascular: Normal rate and regular rhythm.   Pulmonary/Chest: Effort normal and breath sounds normal. No respiratory distress. He has no wheezes. He has no rales.  Abdominal: Soft. He exhibits no mass. There is no tenderness. There is no rebound and no guarding.  Musculoskeletal: He exhibits no edema.  Neurological: He is alert and oriented to person, place, and time.       Assessment:     #1 Recent bilateral flank pain. Suspect musculoskeletal. Patient is concerned about potential interaction with omeprazole and losartan and kidney failure though his had no symptoms to really suggest this  #2 hypertension-stable.    Plan:     -Check urinalysis and basic metabolic panel -continue current medications for now.  Kristian CoveyBruce W Silvie Obremski MD [facility]

## 2016-12-25 LAB — URINALYSIS, MICROSCOPIC ONLY: WBC, UA: NONE SEEN (ref 0–?)

## 2017-01-02 ENCOUNTER — Other Ambulatory Visit: Payer: Self-pay | Admitting: Family Medicine

## 2017-02-02 ENCOUNTER — Other Ambulatory Visit: Payer: Self-pay | Admitting: Family Medicine

## 2017-04-01 DIAGNOSIS — G8929 Other chronic pain: Secondary | ICD-10-CM | POA: Diagnosis not present

## 2017-04-01 DIAGNOSIS — M25511 Pain in right shoulder: Secondary | ICD-10-CM | POA: Diagnosis not present

## 2017-04-06 DIAGNOSIS — S4981XA Other specified injuries of right shoulder and upper arm, initial encounter: Secondary | ICD-10-CM | POA: Diagnosis not present

## 2017-08-02 ENCOUNTER — Other Ambulatory Visit: Payer: Self-pay | Admitting: Family Medicine

## 2017-08-13 DIAGNOSIS — G8929 Other chronic pain: Secondary | ICD-10-CM | POA: Diagnosis not present

## 2017-08-13 DIAGNOSIS — M25512 Pain in left shoulder: Secondary | ICD-10-CM | POA: Diagnosis not present

## 2017-08-19 ENCOUNTER — Other Ambulatory Visit: Payer: Self-pay | Admitting: Family Medicine

## 2017-12-28 DIAGNOSIS — G894 Chronic pain syndrome: Secondary | ICD-10-CM | POA: Diagnosis not present

## 2017-12-28 DIAGNOSIS — Z79891 Long term (current) use of opiate analgesic: Secondary | ICD-10-CM | POA: Diagnosis not present

## 2018-01-28 ENCOUNTER — Other Ambulatory Visit: Payer: Self-pay | Admitting: Family Medicine

## 2018-01-28 NOTE — Telephone Encounter (Signed)
Pt needs to schedule a physical app before next refill

## 2018-02-19 ENCOUNTER — Other Ambulatory Visit: Payer: Self-pay | Admitting: Family Medicine

## 2018-03-20 DEATH — deceased

## 2018-05-07 ENCOUNTER — Other Ambulatory Visit: Payer: Self-pay | Admitting: *Deleted

## 2018-05-07 MED ORDER — OMEPRAZOLE 20 MG PO CPDR: 20.0000 mg | DELAYED_RELEASE_CAPSULE | Freq: Every day | ORAL | 0 refills | Status: AC

## 2018-05-27 ENCOUNTER — Other Ambulatory Visit: Payer: Self-pay | Admitting: Family Medicine
